# Patient Record
Sex: Female | Born: 1970 | Race: Black or African American | Hispanic: No | Marital: Single | Smoking: Current some day smoker
Health system: Southern US, Community
[De-identification: ages and names within clinical notes are randomized; demographics above are authoritative.]

## PROBLEM LIST (undated history)

## (undated) DIAGNOSIS — T1491XA Suicide attempt, initial encounter: Secondary | ICD-10-CM

## (undated) DIAGNOSIS — F319 Bipolar disorder, unspecified: Secondary | ICD-10-CM

## (undated) DIAGNOSIS — K219 Gastro-esophageal reflux disease without esophagitis: Secondary | ICD-10-CM

## (undated) DIAGNOSIS — G43909 Migraine, unspecified, not intractable, without status migrainosus: Secondary | ICD-10-CM

## (undated) DIAGNOSIS — Z8659 Personal history of other mental and behavioral disorders: Secondary | ICD-10-CM

## (undated) DIAGNOSIS — F329 Major depressive disorder, single episode, unspecified: Secondary | ICD-10-CM

## (undated) DIAGNOSIS — F32A Depression, unspecified: Secondary | ICD-10-CM

## (undated) HISTORY — PX: LIPOMA EXCISION: SHX5283

## (undated) HISTORY — DX: Gastro-esophageal reflux disease without esophagitis: K21.9

## (undated) HISTORY — DX: Migraine, unspecified, not intractable, without status migrainosus: G43.909

## (undated) HISTORY — PX: BUNIONECTOMY: SHX129

## (undated) HISTORY — DX: Personal history of other mental and behavioral disorders: Z86.59

## (undated) HISTORY — PX: LAMINECTOMY: SHX219

## (undated) HISTORY — PX: CHOLECYSTECTOMY: SHX55

---
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
## 2013-03-23 HISTORY — PX: BACK SURGERY: SHX140

## 2015-11-21 ENCOUNTER — Encounter (HOSPITAL_COMMUNITY): Payer: Self-pay | Admitting: Cardiology

## 2015-11-21 ENCOUNTER — Emergency Department (HOSPITAL_COMMUNITY)
Admission: EM | Admit: 2015-11-21 | Discharge: 2015-11-21 | Disposition: A | Discharge status: Other Acute Inpatient Hospital | Payer: Medicaid Other | Attending: Emergency Medicine | Admitting: Emergency Medicine

## 2015-11-21 ENCOUNTER — Inpatient Hospital Stay (HOSPITAL_COMMUNITY)
Admission: AD | Admit: 2015-11-21 | Discharge: 2015-11-27 | DRG: 885 | Disposition: A | Discharge status: Home / Self Care | Payer: MEDICAID | Source: Intra-hospital | Attending: Psychiatry | Admitting: Psychiatry

## 2015-11-21 DIAGNOSIS — Z79899 Other long term (current) drug therapy: Secondary | ICD-10-CM | POA: Insufficient documentation

## 2015-11-21 DIAGNOSIS — Y929 Unspecified place or not applicable: Secondary | ICD-10-CM | POA: Diagnosis not present

## 2015-11-21 DIAGNOSIS — Y999 Unspecified external cause status: Secondary | ICD-10-CM | POA: Insufficient documentation

## 2015-11-21 DIAGNOSIS — F32A Depression, unspecified: Secondary | ICD-10-CM

## 2015-11-21 DIAGNOSIS — Y939 Activity, unspecified: Secondary | ICD-10-CM | POA: Diagnosis not present

## 2015-11-21 DIAGNOSIS — X781XXA Intentional self-harm by knife, initial encounter: Secondary | ICD-10-CM | POA: Diagnosis not present

## 2015-11-21 DIAGNOSIS — Z23 Encounter for immunization: Secondary | ICD-10-CM | POA: Diagnosis not present

## 2015-11-21 DIAGNOSIS — Z7289 Other problems related to lifestyle: Secondary | ICD-10-CM

## 2015-11-21 DIAGNOSIS — G4700 Insomnia, unspecified: Secondary | ICD-10-CM | POA: Diagnosis present

## 2015-11-21 DIAGNOSIS — T1491 Suicide attempt: Secondary | ICD-10-CM | POA: Diagnosis present

## 2015-11-21 DIAGNOSIS — F329 Major depressive disorder, single episode, unspecified: Secondary | ICD-10-CM

## 2015-11-21 DIAGNOSIS — F323 Major depressive disorder, single episode, severe with psychotic features: Principal | ICD-10-CM | POA: Diagnosis present

## 2015-11-21 DIAGNOSIS — S1191XA Laceration without foreign body of unspecified part of neck, initial encounter: Secondary | ICD-10-CM | POA: Insufficient documentation

## 2015-11-21 DIAGNOSIS — K219 Gastro-esophageal reflux disease without esophagitis: Secondary | ICD-10-CM | POA: Diagnosis present

## 2015-11-21 DIAGNOSIS — F419 Anxiety disorder, unspecified: Secondary | ICD-10-CM | POA: Diagnosis present

## 2015-11-21 DIAGNOSIS — R45851 Suicidal ideations: Secondary | ICD-10-CM

## 2015-11-21 DIAGNOSIS — R45850 Homicidal ideations: Secondary | ICD-10-CM | POA: Diagnosis present

## 2015-11-21 HISTORY — DX: Suicide attempt, initial encounter: T14.91XA

## 2015-11-21 HISTORY — DX: Depression, unspecified: F32.A

## 2015-11-21 HISTORY — DX: Major depressive disorder, single episode, unspecified: F32.9

## 2015-11-21 HISTORY — DX: Bipolar disorder, unspecified: F31.9

## 2015-11-21 LAB — CBC
HEMATOCRIT: 36.3 % (ref 36.0–46.0)
Hemoglobin: 11.5 g/dL — ABNORMAL LOW (ref 12.0–15.0)
MCH: 23.3 pg — ABNORMAL LOW (ref 26.0–34.0)
MCHC: 31.7 g/dL (ref 30.0–36.0)
MCV: 73.5 fL — AB (ref 78.0–100.0)
Platelets: 226 10*3/uL (ref 150–400)
RBC: 4.94 MIL/uL (ref 3.87–5.11)
RDW: 15.6 % — AB (ref 11.5–15.5)
WBC: 6.2 10*3/uL (ref 4.0–10.5)

## 2015-11-21 LAB — RAPID URINE DRUG SCREEN, HOSP PERFORMED
Amphetamines: NOT DETECTED
BARBITURATES: NOT DETECTED
BENZODIAZEPINES: NOT DETECTED
Cocaine: NOT DETECTED
Opiates: NOT DETECTED
Tetrahydrocannabinol: NOT DETECTED

## 2015-11-21 LAB — COMPREHENSIVE METABOLIC PANEL
ALBUMIN: 4.2 g/dL (ref 3.5–5.0)
ALK PHOS: 82 U/L (ref 38–126)
ALT: 59 U/L — ABNORMAL HIGH (ref 14–54)
AST: 54 U/L — AB (ref 15–41)
Anion gap: 10 (ref 5–15)
BILIRUBIN TOTAL: 0.5 mg/dL (ref 0.3–1.2)
BUN: 6 mg/dL (ref 6–20)
CALCIUM: 9.3 mg/dL (ref 8.9–10.3)
CO2: 24 mmol/L (ref 22–32)
Chloride: 104 mmol/L (ref 101–111)
Creatinine, Ser: 0.8 mg/dL (ref 0.44–1.00)
GFR calc Af Amer: >60 mL/min (ref >60)
GFR calc non Af Amer: >60 mL/min (ref >60)
GLUCOSE: 105 mg/dL — AB (ref 65–99)
Potassium: 3.6 mmol/L (ref 3.5–5.1)
Sodium: 138 mmol/L (ref 135–145)
TOTAL PROTEIN: 7.7 g/dL (ref 6.5–8.1)

## 2015-11-21 LAB — ETHANOL: Alcohol, Ethyl (B): 9 mg/dL — ABNORMAL HIGH (ref <5)

## 2015-11-21 LAB — ACETAMINOPHEN LEVEL: Acetaminophen (Tylenol), Serum: <10 ug/mL — ABNORMAL LOW (ref 10–30)

## 2015-11-21 LAB — SALICYLATE LEVEL: Salicylate Lvl: <4 mg/dL (ref 2.8–30.0)

## 2015-11-21 MED ORDER — LORAZEPAM 1 MG PO TABS: 1.0000 mg | ORAL_TABLET | Freq: Three times a day (TID) | ORAL | Status: DC | PRN

## 2015-11-21 MED ORDER — BACITRACIN ZINC 500 UNIT/GM EX OINT
TOPICAL_OINTMENT | Freq: Once | CUTANEOUS | Status: AC
  Administered 2015-11-21: 2 via TOPICAL
  Filled 2015-11-21: qty 1.8

## 2015-11-21 MED ORDER — TRAZODONE HCL 50 MG PO TABS
50.0000 mg | ORAL_TABLET | Freq: Every evening | ORAL | Status: DC | PRN
Start: 2015-11-22 — End: 2015-11-22
  Filled 2015-11-21: qty 1

## 2015-11-21 MED ORDER — IBUPROFEN 400 MG PO TABS
600.0000 mg | ORAL_TABLET | Freq: Three times a day (TID) | ORAL | Status: DC | PRN
  Administered 2015-11-21: 600 mg via ORAL
  Filled 2015-11-21: qty 2

## 2015-11-21 MED ORDER — TETANUS-DIPHTH-ACELL PERTUSSIS 5-2.5-18.5 LF-MCG/0.5 IM SUSP
0.5000 mL | Freq: Once | INTRAMUSCULAR | Status: AC
  Administered 2015-11-21: 0.5 mL via INTRAMUSCULAR
  Filled 2015-11-21: qty 0.5

## 2015-11-21 NOTE — ED Notes (Signed)
Pt has superficial laceration to left lateral/anterior neck and bilateral anterior wrists. Pt reports she recently moved here from [place] and has been out of her medications for approximately 2 weeks. Pt states "it just all built up and I did this"- does not elaborate on specific stressors. Pt states she lives with her sister and mother and her sister found her this am and called 911. Pt states she does not have a psychiatrist/counselor or any outpatient resources here.

## 2015-11-21 NOTE — ED Provider Notes (Signed)
[facility] DEPT Provider Note   CSN: [id number] Arrival date & time: 11/21/15  1048  By signing my name below, I, Placido Sou, attest that this documentation has been prepared under the direction and in the presence of Cathren Laine, MD. Electronically Signed: Placido Sou, ED Scribe. 11/21/15. 12:07 PM.   History   Chief Complaint Chief Complaint  Patient presents with  . V70.1    HPI HPI Comments: Danielle Sweeney is a 45 y.o. female with a history of bipolar 1 disorder, depression and suicide attempt who presents to the Emergency Department due to multiple self inflicted lacerations w/o active bleeding to her neck and wrists which occurred last night. Pt states she has been off of all of her medications for 2 weeks and beginning 1 week ago began feeling suicidal. Last night she used a knife to cut her left neck and bilateral volar wrists. Pt has an IUD (Mirena) in place. She is unsure of the timing of her most recent tetanus vaccinations. Pt denies any other regions of injury or having taken any medications. Pt denies any new stressors or health conditions. Pt has been admitted for behavioral health services in the past. She denies changes in appetite, HA, sore throat, CP, abd pain, n/v/d/c and leg pain.    The history is provided by the patient. No language interpreter was used.    Past Medical History:  Diagnosis Date  . Bipolar 1 disorder (HCC)   . Depression   . Suicide attempt (HCC)     There are no active problems to display for this patient.   Past Surgical History:  Procedure Laterality Date  . BUNIONECTOMY    . LIPOMA EXCISION      OB History    No data available       Home Medications    Prior to Admission medications   Not on File    Family History History reviewed. No pertinent family history.  Social History Social History  Substance Use Topics  . Smoking status: Never Smoker  . Smokeless tobacco: Never Used  . Alcohol use Yes      Comment: occasional     Allergies   Codeine and Percocet [oxycodone-acetaminophen]   Review of Systems Review of Systems  Constitutional: Negative for appetite change.  HENT: Negative for sore throat.   Eyes: Negative for visual disturbance.  Respiratory: Negative for shortness of breath.   Cardiovascular: Negative for chest pain.  Gastrointestinal: Negative for abdominal pain, constipation, diarrhea, nausea and vomiting.  Skin: Positive for wound.  Neurological: Negative for weakness, numbness and headaches.  Psychiatric/Behavioral: Positive for self-injury and suicidal ideas. Negative for confusion.  All other systems reviewed and are negative.  Physical Exam Updated Vital Signs BP 140/70 (BP Location: Left Arm)   Pulse 82   Temp 98.1 F (36.7 C) (Oral)   Resp 16   Ht 5\' 7"  (1.702 m)   Wt 200 lb (90.7 kg)   SpO2 98%   BMI 31.32 kg/m   Physical Exam  Constitutional: She is oriented to person, place, and time. She appears well-developed and well-nourished. No distress.  HENT:  Head: Normocephalic and atraumatic.  Eyes: EOM are normal.  Neck: Normal range of motion.  Superficial lac left neck, not through all layers of skin, not through platysma.   Cardiovascular: Normal rate, regular rhythm and normal heart sounds.   Pulmonary/Chest: Effort normal and breath sounds normal.  Abdominal: Soft. She exhibits no distension. There is no tenderness.  Musculoskeletal: She exhibits  no edema.  V superficial self inflected wounds to bil wrists, not requiring sutures. Distal pulses palp bil.   Neurological: She is alert and oriented to person, place, and time.  Speech clear/fluent. Motor/sens intact bil ext.   Skin: Skin is warm and dry.  Psychiatric: She expresses suicidal ideation.  Depressed mood, flat affect. +SI.   Nursing note and vitals reviewed.   ED Treatments / Results  Labs (all labs ordered are listed, but only abnormal results are displayed) Results for  orders placed or performed during the hospital encounter of 11/21/15  Comprehensive metabolic panel  Result Value Ref Range   Sodium 138 135 - 145 mmol/L   Potassium 3.6 3.5 - 5.1 mmol/L   Chloride 104 101 - 111 mmol/L   CO2 24 22 - 32 mmol/L   Glucose, Bld 105 (H) 65 - 99 mg/dL   BUN 6 6 - 20 mg/dL   Creatinine, Ser 1.61 0.44 - 1.00 mg/dL   Calcium 9.3 8.9 - 09.6 mg/dL   Total Protein 7.7 6.5 - 8.1 g/dL   Albumin 4.2 3.5 - 5.0 g/dL   AST 54 (H) 15 - 41 U/L   ALT 59 (H) 14 - 54 U/L   Alkaline Phosphatase 82 38 - 126 U/L   Total Bilirubin 0.5 0.3 - 1.2 mg/dL   GFR calc non Af Amer >60 >60 mL/min   GFR calc Af Amer >60 >60 mL/min   Anion gap 10 5 - 15  Ethanol  Result Value Ref Range   Alcohol, Ethyl (B) 9 (H) <5 mg/dL  Salicylate level  Result Value Ref Range   Salicylate Lvl <4.0 2.8 - 30.0 mg/dL  Acetaminophen level  Result Value Ref Range   Acetaminophen (Tylenol), Serum <10 (L) 10 - 30 ug/mL  cbc  Result Value Ref Range   WBC 6.2 4.0 - 10.5 K/uL   RBC 4.94 3.87 - 5.11 MIL/uL   Hemoglobin 11.5 (L) 12.0 - 15.0 g/dL   HCT 04.5 40.9 - 81.1 %   MCV 73.5 (L) 78.0 - 100.0 fL   MCH 23.3 (L) 26.0 - 34.0 pg   MCHC 31.7 30.0 - 36.0 g/dL   RDW 91.4 (H) 78.2 - 95.6 %   Platelets 226 150 - 400 K/uL  Rapid urine drug screen (hospital performed)  Result Value Ref Range   Opiates NONE DETECTED NONE DETECTED   Cocaine NONE DETECTED NONE DETECTED   Benzodiazepines NONE DETECTED NONE DETECTED   Amphetamines NONE DETECTED NONE DETECTED   Tetrahydrocannabinol NONE DETECTED NONE DETECTED   Barbiturates NONE DETECTED NONE DETECTED   EKG  EKG Interpretation None       Radiology No results found.  Procedures Procedures  DIAGNOSTIC STUDIES: Oxygen Saturation is 98% on RA, normal by my interpretation.    COORDINATION OF CARE: 12:01 PM Discussed next steps with pt. Pt verbalized understanding and is agreeable with the plan.    Medications Ordered in ED Medications - No data  to display   Initial Impression / Assessment and Plan / ED Course  I have reviewed the triage vital signs and the nursing notes.  Pertinent labs & imaging results that were available during my care of the patient were reviewed by me and considered in my medical decision making (see chart for details).  Clinical Course    I personally performed the services described in this documentation, which was scribed in my presence. The recorded information has been reviewed and considered. Cathren Laine, MD  Labs sent.  BH team consulted.  Wounds do not require sutures.  Wounds cleaned, bacitracin and sterile dressings applied.  Pt unsure of when last tetanus imm.  Tetanus im.  Patient appears medically clear for psych eval.  [facility]     Final Clinical Impressions(s) / ED Diagnoses   Final diagnoses:  None    New Prescriptions New Prescriptions   No medications on file     Cathren LaineKevin Sidnee Gambrill, MD 11/21/15 1306

## 2015-11-21 NOTE — Progress Notes (Signed)
[facility] Attending Dr. Jama Flavorsobos, report [phone].

## 2015-11-21 NOTE — ED Triage Notes (Signed)
Pt self inflicted lacerations to throat and bilateral wrist.  Pt admits to being suicidal last night.  Denies SI at this time.

## 2015-11-21 NOTE — BH Assessment (Addendum)
Tele Assessment Note   Danielle Sweeney is an 45 y.o. female  who presents accompanied by police  reporting symptoms of depression and suicidal ideation after a suicide attempt by cutting her throat and wrists last night. She states she was found by her sister. Pt has a history of bipolar with psychotic features and says she was referred for assessment by her sister. Pt reports that she has been unable to take her medication for the past two weeks because her daughter has been mailing it to her from [place], and her daughter was unable to send it. "I am fine on my medications". She has a history of 5 past attempt and 3 ECT treatments. Last hospitalization was in 2008.   Pt acknowledges symptoms including crying spells, social withdrawal, loss of interest in usual pleasures, decreased concentration,  decreased sleep, and feelings of hopelessness. PT denies homicidal ideation or history of violence. Pt denies auditory or visual hallucinations or other psychotic symptoms, but does have this history. Pt denies alcohol or substance abuse.  Pt states current stressors include her recent move and financial problems. 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 She states she has gained 70 lbs since she separated from her husband 5 yrs ago. Pt lives with her mom and sister, and supports include both of them plus her daughter . Pt denies history of abuse and trauma. Pt reports there is a family history of a sister using crack. Pt has fair insight and judgement. 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  Pt's OP history includes treatment in [place].  Pt is casually dressed, alert, oriented x4 with normal speech and normal motor behavior. Eye contact is good.  Pt's mood is depressed and affect is congruent with mood. Thought process is coherent and relevant. There is no indication Pt is currently responding to internal stimuli or  experiencing delusional thought content. Pt was cooperative throughout assessment. Pt is currently unable to contract for safety outside the hospital and wants inpatient psychiatric treatment.  Dr. Lucianne Muss recommends OP treatment.  TTS to seek placement.    Diagnosis: Bipolar Disorder, severe, depressed without psychotic features  Past Medical History:  Past Medical History:  Diagnosis Date  . Bipolar 1 disorder (HCC)   . Depression   . Suicide attempt [facility])     Past Surgical History:  Procedure Laterality Date  . BUNIONECTOMY    . LIPOMA EXCISION      Family History: History reviewed. No pertinent family history.  Social History:  reports that she has never smoked. She has never used smokeless tobacco. She reports that she drinks alcohol. She reports that she does not use drugs.  Additional Social History:  Alcohol / Drug Use Pain Medications: denies Prescriptions: denies Over the Counter: denies History of alcohol / drug use?: No history of alcohol / drug abuse Longest period of sobriety (when/how long): denies Negative Consequences of Use:  (denies)  CIWA: CIWA-Ar BP: 140/70 Pulse Rate: 82 COWS:    PATIENT STRENGTHS: (choose at least two) Ability for insight Average or above average intelligence Capable of independent living Communication skills Motivation for treatment/growth Supportive family/friends Work skills  Allergies:  Allergies  Allergen Reactions  . Codeine Itching  . Percocet [Oxycodone-Acetaminophen] Itching    Home Medications:  (Not in a hospital admission)  OB/GYN Status:  No LMP recorded.  General Assessment Data Location of Assessment:  [facility] T[facility] Assessment: In system Is this a Tele or Face-to-Face Assessment?: Tele Assessment Is this an Initial Assessment or a Re-assessment for this encounter?: Initial Assessment Marital status: Separated Maiden name: Davonna BellingWarrick Is patient pregnant?: No Pregnancy Status: No Living Arrangements:  Parent (sister) Can pt return to current living arrangement?: Yes Admission Status: Voluntary Is patient capable of signing voluntary admission?: Yes Referral Source: Self/Family/Friend (police) Insurance type: MCD     Crisis Care Plan Living Arrangements: Parent (sister) Name of Psychiatrist:  (none ) Name of Therapist:  (in [place])  Education Status Is patient currently in school?: No Highest grade of school patient has completed:  (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 : Yes Suicidal Intent: Yes-Currently Present Has patient had any suicidal intent within the past 6 months prior to admission? : Yes Is patient at risk for suicide?: Yes Suicidal Plan?: Yes-Currently Present Has patient had any suicidal plan within the past 6 months prior to admission? : Yes Specify Current Suicidal Plan: cut wrist and throat last night Access to Means: Yes Specify Access to Suicidal Means: razor What has been your use of drugs/alcohol within the last 12 months?: denies Previous Attempts/Gestures: Yes How many times?: 5 Other Self Harm Risks: none Intentional Self Injurious Behavior: None Family Suicide History: No Recent stressful life event(s): Financial Problems (moving) Persecutory voices/beliefs?: No Depression: Yes Depression Symptoms: Insomnia, Isolating, Loss of interest in usual pleasures, Feeling worthless/self pity Substance abuse history and/or treatment for substance abuse?: No Suicide prevention information given to non-admitted patients: Not applicable  Risk to Others within the past 6 months Homicidal Ideation: No Does patient have any lifetime risk of violence toward others beyond the six months prior to admission? : No Thoughts of Harm to Others: No Current Homicidal Intent: No Current Homicidal Plan: No Access to Homicidal Means:  No History of harm to others?: No Assessment of Violence: None Noted Does patient have access to weapons?: No Criminal Charges Pending?: No Does patient have a court date: No Is patient on probation?: No  Psychosis Hallucinations: None noted Delusions: None noted  Mental Status Report Appearance/Hygiene: In scrubs, Unremarkable Eye Contact: Good Motor Activity: Unremarkable Speech: Logical/coherent Level of Consciousness: Alert Mood: Depressed, Pleasant Affect: Appropriate to circumstance Anxiety Level: None Thought Processes: Coherent, Relevant Judgement: Partial Orientation: Person, Place, Time, Situation, Appropriate for developmental age Obsessive Compulsive Thoughts/Behaviors: Moderate (cleaning)  Cognitive Functioning Concentration: Fair Memory: Remote Impaired, Recent Impaired (from ECT) IQ: Average Insight: Good Impulse Control: Poor Appetite: Fair Weight Gain:  (70 lbs in 5 years) Sleep: Decreased Total Hours of Sleep: 4 Vegetative Symptoms: None  ADLScreening [facility] Assessment Services) [name] cognitive ability adequate to safely complete daily activities?: Yes Patient able to express need for assistance with ADLs?: Yes Independently performs ADLs?: Yes (appropriate for developmental age)  Prior Inpatient Therapy Prior Inpatient Therapy: Yes (3) Prior Therapy Dates:  (2008 last time) Prior Therapy Facilty/Provider(s):  (in [place]) Reason for Treatment:  (depression)  Prior Outpatient Therapy Prior Outpatient Therapy: Yes Prior Therapy Dates: ended in May 2017 when pt moved Reason for Treatment:  (depression) Does patient have an ACCT team?: No Does patient have Intensive In-House Services?  : No Does patient have [facility] services? : No Does patient have P4CC services?: No  ADL Screening (condition at time of admission) [name] cognitive ability adequate to safely complete daily activities?:  Yes Is the patient deaf or have difficulty hearing?:  No Does the patient have difficulty seeing, even when wearing glasses/contacts?: No Does the patient have difficulty concentrating, remembering, or making decisions?: No Patient able to express need for assistance with ADLs?: Yes Does the patient have difficulty dressing or bathing?: No Independently performs ADLs?: Yes (appropriate for developmental age) Does the patient have difficulty walking or climbing stairs?: No Weakness of Legs: None Weakness of Arms/Hands: None  Home Assistive Devices/Equipment Home Assistive Devices/Equipment: None    Abuse/Neglect Assessment (Assessment to be complete while patient is alone) Physical Abuse: Denies Verbal Abuse: Denies Sexual Abuse: Denies Exploitation of patient/patient's resources: Denies Self-Neglect: Denies Values / Beliefs Cultural Requests During Hospitalization: None Spiritual Requests During Hospitalization: None   Advance Directives (For Healthcare) Does patient have an advance directive?: No Would patient like information on creating an advanced directive?: No - patient declined information    Additional Information 1:1 In Past 12 Months?: No CIRT Risk: No Elopement Risk: No Does patient have medical clearance?: Yes     Disposition:  Disposition Initial Assessment Completed for this Encounter: Yes Disposition of Patient: Inpatient treatment program Type of inpatient treatment program: Adult  Theo Dills 11/21/2015 2:39 PM

## 2015-11-21 NOTE — Progress Notes (Signed)
Pt referred to inpatient psychiatric treatment at recommendation of TTS.  [facility]- per Jim Desanctisarla Rowan - per [facility]al[facility] GumsBarbara [facility]  Ilean SkillMeghan Ferguson Gertner, MSW, LCSW Clinical Social Work, Disposition  11/21/2015 [phone]

## 2015-11-22 ENCOUNTER — Encounter (HOSPITAL_COMMUNITY): Payer: Self-pay | Admitting: *Deleted

## 2015-11-22 DIAGNOSIS — F329 Major depressive disorder, single episode, unspecified: Secondary | ICD-10-CM | POA: Diagnosis not present

## 2015-11-22 DIAGNOSIS — R45851 Suicidal ideations: Secondary | ICD-10-CM | POA: Diagnosis not present

## 2015-11-22 DIAGNOSIS — R45850 Homicidal ideations: Secondary | ICD-10-CM

## 2015-11-22 DIAGNOSIS — Z888 Allergy status to other drugs, medicaments and biological substances status: Secondary | ICD-10-CM | POA: Diagnosis not present

## 2015-11-22 DIAGNOSIS — Z79899 Other long term (current) drug therapy: Secondary | ICD-10-CM

## 2015-11-22 MED ORDER — PALIPERIDONE ER 6 MG PO TB24
6.0000 mg | ORAL_TABLET | Freq: Every day | ORAL | Status: DC
  Administered 2015-11-22 – 2015-11-27 (×6): 6 mg via ORAL
  Filled 2015-11-22 (×3): qty 1
  Filled 2015-11-22: qty 7
  Filled 2015-11-22 (×4): qty 1

## 2015-11-22 MED ORDER — ZOLPIDEM TARTRATE 5 MG PO TABS
5.0000 mg | ORAL_TABLET | Freq: Every day | ORAL | Status: DC
  Administered 2015-11-22 – 2015-11-24 (×3): 5 mg via ORAL
  Filled 2015-11-22 (×3): qty 1

## 2015-11-22 MED ORDER — ZOLPIDEM TARTRATE 10 MG PO TABS: 10.0000 mg | ORAL_TABLET | Freq: Every day | ORAL | Status: DC

## 2015-11-22 MED ORDER — CLONAZEPAM 1 MG PO TABS: 1.5000 mg | ORAL_TABLET | Freq: Two times a day (BID) | ORAL | Status: DC | PRN

## 2015-11-22 MED ORDER — MAGNESIUM HYDROXIDE 400 MG/5ML PO SUSP: 30.0000 mL | Freq: Every day | ORAL | Status: DC | PRN

## 2015-11-22 MED ORDER — LAMOTRIGINE 200 MG PO TABS: 200.0000 mg | ORAL_TABLET | Freq: Two times a day (BID) | ORAL | Status: DC

## 2015-11-22 MED ORDER — TRAZODONE HCL 50 MG PO TABS
50.0000 mg | ORAL_TABLET | Freq: Every evening | ORAL | Status: DC | PRN
  Administered 2015-11-22 – 2015-11-25 (×7): 50 mg via ORAL
  Filled 2015-11-22 (×11): qty 1

## 2015-11-22 MED ORDER — BENZTROPINE MESYLATE 1 MG PO TABS
1.0000 mg | ORAL_TABLET | Freq: Two times a day (BID) | ORAL | Status: DC
  Administered 2015-11-22 – 2015-11-27 (×11): 1 mg via ORAL
  Filled 2015-11-22 (×2): qty 1
  Filled 2015-11-22: qty 14
  Filled 2015-11-22 (×4): qty 1
  Filled 2015-11-22: qty 14
  Filled 2015-11-22 (×7): qty 1

## 2015-11-22 MED ORDER — CLONAZEPAM 0.5 MG PO TABS
0.5000 mg | ORAL_TABLET | Freq: Two times a day (BID) | ORAL | Status: DC | PRN
  Administered 2015-11-22 – 2015-11-26 (×5): 0.5 mg via ORAL
  Filled 2015-11-22 (×5): qty 1

## 2015-11-22 MED ORDER — PROPRANOLOL HCL 10 MG PO TABS
10.0000 mg | ORAL_TABLET | Freq: Every day | ORAL | Status: DC
  Administered 2015-11-22 – 2015-11-27 (×6): 10 mg via ORAL
  Filled 2015-11-22: qty 7
  Filled 2015-11-22 (×7): qty 1

## 2015-11-22 MED ORDER — LAMOTRIGINE 25 MG PO TABS
25.0000 mg | ORAL_TABLET | Freq: Every day | ORAL | Status: DC
  Administered 2015-11-22 – 2015-11-27 (×6): 25 mg via ORAL
  Filled 2015-11-22: qty 1
  Filled 2015-11-22: qty 7
  Filled 2015-11-22 (×6): qty 1

## 2015-11-22 MED ORDER — ALUM & MAG HYDROXIDE-SIMETH 200-200-20 MG/5ML PO SUSP
30.0000 mL | ORAL | Status: DC | PRN
  Administered 2015-11-26 (×2): 30 mL via ORAL
  Filled 2015-11-22 (×2): qty 30

## 2015-11-22 MED ORDER — PALIPERIDONE ER 6 MG PO TB24: 9.0000 mg | ORAL_TABLET | Freq: Every day | ORAL | Status: DC

## 2015-11-22 MED ORDER — IBUPROFEN 400 MG PO TABS
400.0000 mg | ORAL_TABLET | Freq: Three times a day (TID) | ORAL | Status: DC | PRN
  Administered 2015-11-22 – 2015-11-24 (×5): 400 mg via ORAL
  Filled 2015-11-22 (×6): qty 1

## 2015-11-22 MED ORDER — ESCITALOPRAM OXALATE 20 MG PO TABS
20.0000 mg | ORAL_TABLET | Freq: Every day | ORAL | Status: DC
  Administered 2015-11-22 – 2015-11-27 (×6): 20 mg via ORAL
  Filled 2015-11-22 (×5): qty 1
  Filled 2015-11-22: qty 7
  Filled 2015-11-22 (×2): qty 1

## 2015-11-22 MED ORDER — ARIPIPRAZOLE 2 MG PO TABS
2.0000 mg | ORAL_TABLET | Freq: Every day | ORAL | Status: DC
  Administered 2015-11-22 – 2015-11-23 (×2): 2 mg via ORAL
  Filled 2015-11-22 (×4): qty 1

## 2015-11-22 NOTE — Progress Notes (Signed)
Recreation Therapy Notes  Date: 11/22/15 Time: 0930 Location: [address] Dayroom  Group Topic: Stress Management  Goal Area(s) Addresses:  Patient will verbalize importance of using healthy stress management.  Patient will identify positive emotions associated with healthy stress management.   Behavioral Response: Engaged  Intervention: Stress Management  Activity :  Guided Training and development officermagery Script.  LRT introduced the stress management technique guided imagery.  LRT read script that guided patients in how to perform the technique.  Patients were to follow along as LRT read the script to engage in the technique.  Education: Stress Management, Discharge Planning.   Education Outcome: Acknowledges edcuation/In group clarification offered/Needs additional education  Clinical Observations/Feedback: Pt attended group.   Danielle Sweeney, Danielle Sweeney         Lillia AbedLindsay, Danielle Sweeney 11/22/2015 1:10 PM

## 2015-11-22 NOTE — H&P (Signed)
Psychiatric Admission Assessment Adult  Patient Identification: Danielle Sweeney MRN:  [id number] Date of Evaluation:  11/22/2015 Chief Complaint:  BIPOLAR SEVERE DEPRESSED WITHOUT PSYCHOSIS Principal Diagnosis: <principal problem not specified> Diagnosis:   Patient Active Problem List   Diagnosis Date Noted  . MDD (major depressive disorder) ([place]) [F32.9] 11/22/2015   History of Present Illness:Danielle Sweeney is a 45 y.o. female with a history of bipolar 1 disorder, depression and suicide attempt who presented to the Emergency Department due to multiple self inflicted lacerations w/o active bleeding to her neck and wrists which occurred the night before . Pt states she has been off of all of her medications for 2 weeks and beginning 1 week ago began feeling suicidal. Last night she used a knife to cut her left neck and bilateral volar wrists.  Associated Signs/Symptoms: Depression Symptoms:  depressed mood, insomnia, difficulty concentrating, recurrent thoughts of death, suicidal thoughts with specific plan, suicidal attempt, disturbed sleep, (Hypo) Manic Symptoms:  None Anxiety Symptoms:  None Psychotic Symptoms:  None PTSD Symptoms: None Total Time spent with patient: 1 hour  Past Psychiatric History: Long history of psychiatric treament --28 years. 3 hospitalizations ., had 4 treatments of ECT during one of the hospitalization-- She says it helped her not ruminate on her childhood depression but she believes it has caused ger some long term memory problems. Had been on lamictal 22m bid, lexapro 254mdaily, Klonopin1.55m355mid,abilify 2mg63md,cogentin img bid,invega 9mg 63mly, propranolol for tremors 10mg 66my and ambien 10mg a855mdtime  Is the patient at risk to self? Yes.    Has the patient been a risk to self in the past 6 months? Yes.    Has the patient been a risk to self within the distant past? Yes.    Is the patient a risk to others? No.  Has the patient  been a risk to others in the past 6 months? No.  Has the patient been a risk to others within the distant past? No.   Prior Inpatient Therapy:   Prior Outpatient Therapy:    Alcohol Screening: 1. How often do you have a drink containing alcohol?: Never 9. Have you or someone else been injured as a result of your drinking?: No 10. Has a relative or friend or a doctor or another health worker been concerned about your drinking or suggested you cut down?: No Alcohol Use Disorder Identification Test Final Score (AUDIT): 0 Brief Intervention: AUDIT score less than 7 or less-screening does not suggest unhealthy drinking-brief intervention not indicated Substance Abuse History in the last 12 months:  No. Consequences of Substance Abuse: NA Previous Psychotropic Medications: Yes  Psychological Evaluations: No  Past Medical History:  Past Medical History:  Diagnosis Date  . Bipolar 1 disorder (HCC)   [place] [facility]   . [facility] ([facility]:  Procedure Laterality Date  . BUNIONECTOMY    . LIPOMA EXCISION     Family History: History reviewed. No pertinent family history. Family Psychiatric  History: Maternal great aunt may have been psychotic Tobacco Screening: Have you used any form of tobacco in the last 30 days? (Cigarettes, Smokeless Tobacco, Cigars, and/or Pipes): No Social History:  History  Alcohol Use  . Yes    Comment: occasional     History  Drug Use No    Additional Social History:  Allergies:   Allergies  Allergen Reactions  . Codeine Itching  . Percocet [Oxycodone-Acetaminophen] Itching   Lab Results:  Results for orders placed or performed during the hospital encounter of 11/21/15 (from the past 48 hour(s))  Rapid urine drug screen (hospital performed)     Status: None   Collection Time: 11/21/15 11:00 AM  Result Value Ref Range   Opiates NONE DETECTED NONE DETECTED   Cocaine NONE DETECTED NONE  DETECTED   Benzodiazepines NONE DETECTED NONE DETECTED   Amphetamines NONE DETECTED NONE DETECTED   Tetrahydrocannabinol NONE DETECTED NONE DETECTED   Barbiturates NONE DETECTED NONE DETECTED    Comment:        DRUG SCREEN FOR MEDICAL PURPOSES ONLY.  IF CONFIRMATION IS NEEDED FOR ANY PURPOSE, NOTIFY LAB WITHIN 5 DAYS.        LOWEST DETECTABLE LIMITS FOR URINE DRUG SCREEN Drug Class       Cutoff (ng/mL) Amphetamine      1000 Barbiturate      200 Benzodiazepine   268 Tricyclics       341 Opiates          300 Cocaine          300 THC              50   Comprehensive metabolic panel     Status: Abnormal   Collection Time: 11/21/15 11:13 AM  Result Value Ref Range   Sodium 138 135 - 145 mmol/L   Potassium 3.6 3.5 - 5.1 mmol/L   Chloride 104 101 - 111 mmol/L   CO2 24 22 - 32 mmol/L   Glucose, Bld 105 (H) 65 - 99 mg/dL   BUN 6 6 - 20 mg/dL   Creatinine, Ser 0.80 0.44 - 1.00 mg/dL   Calcium 9.3 8.9 - 10.3 mg/dL   Total Protein 7.7 6.5 - 8.1 g/dL   Albumin 4.2 3.5 - 5.0 g/dL   AST 54 (H) 15 - 41 U/L   ALT 59 (H) 14 - 54 U/L   Alkaline Phosphatase 82 38 - 126 U/L   Total Bilirubin 0.5 0.3 - 1.2 mg/dL   GFR calc non Af Amer >60 >60 mL/min   GFR calc Af Amer >60 >60 mL/min    Comment: (NOTE) The eGFR has been calculated using the CKD EPI equation. This calculation has not been validated in all clinical situations. eGFR's persistently <60 mL/min signify possible Chronic Kidney Disease.    Anion gap 10 5 - 15  Ethanol     Status: Abnormal   Collection Time: 11/21/15 11:13 AM  Result Value Ref Range   Alcohol, Ethyl (B) 9 (H) <5 mg/dL    Comment:        LOWEST DETECTABLE LIMIT FOR SERUM ALCOHOL IS 5 mg/dL FOR MEDICAL PURPOSES ONLY   Salicylate level     Status: None   Collection Time: 11/21/15 11:13 AM  Result Value Ref Range   Salicylate Lvl <9.6 2.8 - 30.0 mg/dL  Acetaminophen level     Status: Abnormal   Collection Time: 11/21/15 11:13 AM  Result Value Ref Range    Acetaminophen (Tylenol), Serum <10 (L) 10 - 30 ug/mL    Comment:        THERAPEUTIC CONCENTRATIONS VARY SIGNIFICANTLY. A RANGE OF 10-30 ug/mL MAY BE AN EFFECTIVE CONCENTRATION FOR MANY PATIENTS. HOWEVER, SOME ARE BEST TREATED AT CONCENTRATIONS OUTSIDE THIS RANGE. ACETAMINOPHEN CONCENTRATIONS >150 ug/mL AT 4 HOURS AFTER INGESTION AND >50 ug/mL AT 12 HOURS AFTER INGESTION  ARE OFTEN ASSOCIATED WITH TOXIC REACTIONS.   cbc     Status: Abnormal   Collection Time: 11/21/15 11:13 AM  Result Value Ref Range   WBC 6.2 4.0 - 10.5 K/uL   RBC 4.94 3.87 - 5.11 MIL/uL   Hemoglobin 11.5 (L) 12.0 - 15.0 g/dL   HCT 36.3 36.0 - 46.0 %   MCV 73.5 (L) 78.0 - 100.0 fL   MCH 23.3 (L) 26.0 - 34.0 pg   MCHC 31.7 30.0 - 36.0 g/dL   RDW 15.6 (H) 11.5 - 15.5 %   Platelets 226 150 - 400 K/uL    Blood Alcohol level:  Lab Results  Component Value Date   ETH 9 (H) 09/73/5329    Metabolic Disorder Labs:  No results found for: HGBA1C, MPG No results found for: PROLACTIN No results found for: CHOL, TRIG, HDL, CHOLHDL, VLDL, LDLCALC  Current Medications: Current Facility-Administered Medications  Medication Dose Route Frequency Provider Last Rate Last Dose  . alum & mag hydroxide-simeth (MAALOX/MYLANTA) 200-200-20 MG/5ML suspension 30 mL  30 mL Oral Q4H PRN Myer Peer Cobos, MD      . magnesium hydroxide (MILK OF MAGNESIA) suspension 30 mL  30 mL Oral Daily PRN Jenne Campus, MD      . traZODone (DESYREL) tablet 50 mg  50 mg Oral QHS,MR X 1 Dara Hoyer, PA-C   50 mg at 11/22/15 0049   PTA Medications: Prescriptions Prior to Admission  Medication Sig Dispense Refill Last Dose  . ARIPiprazole (ABILIFY) 2 MG tablet Take 2 mg by mouth daily.   11/01/2015  . benztropine (COGENTIN) 1 MG tablet Take 1 mg by mouth 2 (two) times daily.   11/01/2015  . clonazePAM (KLONOPIN) 1 MG tablet Take 1.5 mg by mouth 2 (two) times daily as needed for anxiety.   11/01/2015  . escitalopram (LEXAPRO) 20 MG tablet Take  20 mg by mouth daily.   11/01/2015  . lamoTRIgine (LAMICTAL) 200 MG tablet Take 200 mg by mouth 2 (two) times daily.   11/01/2015  . paliperidone (INVEGA) 9 MG 24 hr tablet Take 9 mg by mouth daily.   11/01/2015  . propranolol (INDERAL) 10 MG tablet Take 10 mg by mouth daily.   11/01/2015  . zolpidem (AMBIEN) 10 MG tablet Take 10 mg by mouth at bedtime.   11/01/2015    Musculoskeletal: Strength & Muscle Tone: within normal limits Gait & Station: normal Patient leans: Right  Psychiatric Specialty Exam: Physical Exam  ROS  Blood pressure 131/79, pulse 93, temperature 98.4 F (36.9 C), temperature source Oral, resp. rate 18, height '5\' 4"'  (1.626 m), weight 91.6 kg (202 lb).Body mass index is 34.67 kg/m.  General Appearance: Casual  Eye Contact:  Fair  Speech:  Clear and Coherent and Normal Rate  Volume:  Normal  Mood:  Depressed  Affect:  Depressed  Thought Process:  Coherent and Goal Directed  Orientation:  Full (Time, Place, and Person)  Thought Content:  Logical  Suicidal Thoughts:  Yes.  with intent/plan  Homicidal Thoughts:  Yes.  with intent/plan  Memory:  Immediate;   Fair Recent;   Fair Remote;   Fair  Judgement:  Fair  Insight:  Fair  Psychomotor Activity:  Normal  Concentration:  Concentration: Fair and Attention Span: Fair  Recall:  [employer] of Knowledge:  Fair  Language:  Fair  Akathisia:  No  Handed:  Right  AIMS (if indicated):     Assets:  Communication Skills Desire for Improvement  Housing Social Support  ADL's:  Intact  Cognition:  WNL  Sleep:          Treatment Plan Summary: Daily contact with patient to assess and evaluate symptoms and progress in treatment, Medication management and Plan to restart home medications but at a lesser dose and increase gradually. Patient will participate in group milieu and individual activities  Observation Level/Precautions:  15 minute checks  Laboratory:  Not been sexually active for 5 years  Psychotherapy:     Medications:    Consultations:    Discharge Concerns:    Estimated LOS:  Other:     I certify that inpatient services furnished can reasonably be expected to improve the patient's condition.    Ruffin Frederick, MD 9/1/20177:51 AM

## 2015-11-22 NOTE — Progress Notes (Signed)
Admission Note:  45 yr old female who presents voluntary, in no acute distress, following a suicide attempt.  Patient reports "I took a knife, said the Lords prayer, and cut myself thinking I would bleed out".  Patient appears flat and depressed. Patient was calm and cooperative with admission process. Patient presents with passive SI and contracts for safety upon admission. Patient denies AVH. Patient reports that she recently moved here from [place].  Patient reports that she has been off her medications since August 11th and had been having her daughter send them from [place] but recently has been unable to get in touch with her daughter.  Patient reports "I started getting depressed, sad, and worrying about my medications".  Patient continues by stating "I wrote letters to people telling them I'm sorry I'm not going to be here anymore and cut myself".  Patient reports that she currently lives with her mother and sister and states that her sister found her bleeding in the bedroom.  Patient identifies her mother and sister as her support system.  [facility], patient would like to "Look for support and find a Therapist, sportssychiatrist and counselor" and "Get back on meds".  Skin was assessed.  Patient had self-inflicted superficial cuts to wrist bilateral and neck.  Patient searched and no contraband found, POC and unit policies explained and understanding verbalized. Consents obtained. Patient had no additional questions or concerns.

## 2015-11-22 NOTE — Plan of Care (Signed)
Problem: Activity: Goal: Sleeping patterns will improve Outcome: Not Progressing Patient has been napping in her room most of shift, even after encouragement to remain up. Patient did attend groups and meals.

## 2015-11-22 NOTE — [facility] Group Notes (Signed)
[facility] LCSW Group Therapy  11/22/2015 9:33 PM  Type of Therapy:  Group Therapy  Participation Level:  Did Not Attend  Modes of Intervention:  Discussion, Education, Socialization and Support  Summary of Progress/Problems: Feelings around Relapse. Group members discussed the meaning of relapse and shared personal stories of relapse, how it affected them and others, and how they perceived themselves during this time. Group members were encouraged to identify triggers, warning signs and coping skills used when facing the possibility of relapse. Social supports were discussed and explored in detail.  Maresa Morash L Tamyia Minich MSW, LCSWA  11/22/2015, 9:33 PM   

## 2015-11-22 NOTE — [facility] Suicide Risk Assessment (Signed)
[facility]   Nursing information obtained from:  Patient Demographic factors:  Divorced or widowed, Unemployed Current Mental Status:  Suicidal ideation indicated by patient, Suicide plan, Plan includes specific time, place, or method, Self-harm thoughts, Self-harm behaviors, Intention to act on suicide plan, Belief that plan would result in death Loss Factors:  Decrease in vocational status Historical Factors:  Prior suicide attempts, Family history of mental illness or substance abuse Risk Reduction Factors:  Living with another person, especially a relative, Positive social support  Total Time spent with patient: 1 hour Principal Problem: <principal problem not specified> Diagnosis:   Patient Active Problem List   Diagnosis Date Noted  . MDD (major depressive disorder) (HCC) [F32.9] 11/22/2015   Subjective Data: See admiddion assessment  Continued Clinical Symptoms:  Alcohol Use Disorder Identification Test Final Score (AUDIT): 0 The "Alcohol Use Disorders Identification Test", Guidelines for Use in Primary Care, Second Edition.  World Science writerHealth Organization [facility](WHO). Score between 0-7:  no or low risk or alcohol related problems. Score between 8-15:  moderate risk of alcohol related problems. Score between 16-19:  high risk of alcohol related problems. Score 20 or above:  warrants further diagnostic evaluation for alcohol dependence and treatment.   CLINICAL FACTORS:   Bipolar Disorder:   Depressive phase   Musculoskeletal: Strength & Muscle Tone: within normal limits Gait & Station: normal Patient leans: Right  Psychiatric Specialty Exam: Physical Exam  ROS  Blood pressure 117/70, pulse 93, temperature 98 F (36.7 C), temperature source Oral, resp. rate 18, height 5\' 4"  (1.626 m), weight 91.6 kg (202 lb).Body mass index is 34.67 kg/m.  See admission assessment COGNITIVE FEATURES THAT CONTRIBUTE TO RISK:  None    SUICIDE RISK:   Moderate:   Frequent suicidal ideation with limited intensity, and duration, some specificity in terms of plans, no associated intent, good self-control, limited dysphoria/symptomatology, some risk factors present, and identifiable protective factors, including available and accessible social support.   PLAN OF CARE: See admission assessment  I certify that inpatient services furnished can reasonably be expected to improve the patient's condition.  Wynelle BourgeoisUreh N Lekauwa, MD 11/22/2015, 8:17 AM

## 2015-11-22 NOTE — Tx Team (Signed)
Initial Treatment Plan 11/22/2015 1:03 AM Danielle Sweeney ZOX:[id number]    PATIENT STRESSORS: Medication change or noncompliance   PATIENT STRENGTHS: Capable of independent living Licensed conveyancerCommunication skills Financial means Motivation for treatment/growth Supportive family/friends   PATIENT IDENTIFIED PROBLEMS: At risk for suicide  Depression  "Look for support such as psychiatrist and counselor"  "Get back on meds"               DISCHARGE CRITERIA:  Ability to meet basic life and health needs Improved stabilization in mood, thinking, and/or behavior Medical problems require only outpatient monitoring Motivation to continue treatment in a less acute level of care Need for constant or close observation no longer present Verbal commitment to aftercare and medication compliance  PRELIMINARY DISCHARGE PLAN: Outpatient therapy Return to previous living arrangement  PATIENT/FAMILY INVOLVEMENT: This treatment plan has been presented to and reviewed with the patient, Danielle Sweeney.  The patient and family have been given the opportunity to ask questions and make suggestions.  Carleene OverlieMiddleton, Maanya Hippert P, RN 11/22/2015, 1:03 AM

## 2015-11-22 NOTE — Progress Notes (Addendum)
Data. Patient denies SI/HI/AVH. Patient has spent most of the shift isolating in her room. She is bright on interaction, but flat/blunted went not interacting. She has been C/O pain in her neck, throughout the day. Patient has refused to complete her self assessment. Action. Pain medication and heat packs applied to her neck for pain relief.  Emotional support and encouragement offered. Education provided on medication, indications and side effect. Q 15 minute checks done for safety. Response. Relief from pain in neck with medications and heat reported. Safety on the unit maintained through 15 minute checks.  Medications taken as prescribed. Attended groups. Remained calm and appropriate through out shift.

## 2015-11-22 NOTE — Progress Notes (Signed)
Adult Psychoeducational Group Note  Date:  11/22/2015 Time:  9:47 PM  Group Topic/Focus:  Wrap-Up Group:   The focus of this group is to help patients review their daily goal of treatment and discuss progress on daily workbooks.   Participation Level:  Active  Participation Quality:  Appropriate  Affect:  Appropriate  Cognitive:  Appropriate  Insight: Appropriate  Engagement in Group:  Engaged  Modes of Intervention:  Discussion  Additional Comments:  Patient attended wrap-up group and said her day was a 4. Her goal for today was to speak to physician about regulating her medications and he did.  She is still working on her coping skills.  Danielle Sweeney W Danielle Sweeney 11/22/2015, 9:47 PM

## 2015-11-23 DIAGNOSIS — F323 Major depressive disorder, single episode, severe with psychotic features: Principal | ICD-10-CM

## 2015-11-23 NOTE — [facility] Group Notes (Signed)
[facility] LCSW Group Therapy  11/23/2015 / 10:10 to 11:00 AM  Type of Therapy:  Group Therapy  Participation Level:  Active  Participation Quality:  Attentive and Sharing  Affect:  Flat  Cognitive:  Alert and Oriented  Insight:  Limited  Engagement in Therapy:  Limited  Modes of Intervention:  Discussion, Exploration, Rapport Building, Socialization and Support  Summary of Progress/Problems: The main focus of today's process group was for the patient to identify ways in which they have in the past sabotaged their own recovery. Motivational Interviewing was utilized to ask the group members what they get out of their self sabotaging behavior(s), and what reasons they may have for wanting to change. Patient shared only when asked direct questions yet was actively engaged in listening as evidenced by her eye contact, body language and the fact she moved into group circle verses continuing to sit outside of circle. Patient shared she is already feeling better knowing she is back on medication and can get resources here in [place]. Patient has been feeling isolated as depression increased   Harrill, Julious Payeratherine Campbell

## 2015-11-23 NOTE — Progress Notes (Signed)
Adult Psychoeducational Group Note  Date:  11/23/2015 Time:  9:28 PM  Group Topic/Focus:  Wrap-Up Group:   The focus of this group is to help patients review their daily goal of treatment and discuss progress on daily workbooks.   Participation Level:  Active  Participation Quality:  Appropriate  Affect:  Appropriate  Cognitive:  Appropriate  Insight: Appropriate  Engagement in Group:  Engaged  Modes of Intervention:  Discussion  Additional Comments:  Pt goal for today was to stay positive and to come out of room. Pt states that she has reached her goal. Pt rated today with an 8. Margorie Johnege, Tsuruko Murtha 11/23/2015, 9:28 PM

## 2015-11-23 NOTE — B[facility] Counselor (Signed)
Adult Comprehensive Assessment  Patient ID: Danielle Sweeney, female   DOB: 1970-11-28, [age] Y.[place].   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[place] and is only a few credits short of degree in [employer] Healthcare[employer] Services Employment / Job issues: Unemployed since move to  in May of 2017 Family Relationships: NA Surveyor, quantityinancial / Lack of resources (include bankruptcy): EMC[employer]Strained Housing / Lack of housing: NA Physical health (include injuries & life threatening diseases): NA other than being off psych med's for 2 weeks and experiencing decrease in ADLs and increased depression Social relationships: Risk analystsolating; has no support network here Substance abuse: NA Bereavement / Loss: Marriage  Living/Environment/Situation:  Living Arrangements: Parent, Other relatives Living conditions (as described by patient or guardian): Pt lives [place] mother and sister in [place] How long has patient lived in current situation?: 4 months in [place]; 5 years in [place] What is atmosphere in current home: Comfortable, ParamedicLoving, Supportive  Family History:  Marital status: Separated Separated, when?: 2012 What types of issues is patient dealing with in the relationship?: [name] depression and episode of noncompliance in 2012 Additional relationship information: Husband has never allowed pt to get her belongings from home since 2012 Are you sexually active?: No What is your sexual orientation?: Hetero Has your sexual activity been affected by drugs, alcohol, medication, or emotional stress?: CSW did not ask Does patient have children?: Yes How many children?: 2 How is patient's relationship with their children?: Good with both 24 and 25 YO daughters  Childhood History:  By whom was/is the patient raised?: Mother Additional childhood history information: Minimal contact with father throughout her life until his  death Description of patient's relationship with caregiver when they were a child: Good with mother; minimal contact w father [name] description of current relationship with people who raised him/her: Good w mother; father deceased How were you disciplined when you got in trouble as a child/adolescent?: "Scolded only; nothing harsh, we were good kids" Does patient have siblings?: Yes Number of Siblings: 3 Description of patient's current relationship with siblings: No relationship with one sister as she is crack abuser living on streets; minimal contact w brother in [place]; good w sister in the home Did patient suffer any verbal/emotional/physical/sexual abuse as a child?: No Did patient suffer from severe childhood neglect?: No Has patient ever been sexually abused/assaulted/raped as an adolescent or adult?: No Was the patient ever a victim of a crime or a disaster?: No Witnessed domestic violence?: No Has patient been effected by domestic violence as an adult?: No  Education:  Highest grade of school patient has completed: 14 Currently a Consulting civil engineerstudent?: No Learning disability?: No  Employment/Work Situation:   Employment situation: Unemployed [name] job has been impacted by current illness: No What is the longest time patient has a held a job?: 24 years in [employer] Where was the patient employed at that time?: [place] Has patient ever been in the Eli [employer] and Companymilitary?: No Has patient ever served in combat?: No Did You Receive Any Psychiatric Treatment/Services While in Equities traderthe Military?: No Are There Guns or Other Weapons in Your Home?: No  Financial Resources:   Surveyor, quantityinancial resources: Support from parents / caregiver Does patient have a Lawyerrepresentative payee or guardian?: No  Alcohol/Substance Abuse:   What has been your use of drugs/alcohol within the last 12 months?: "1 glass of wine every now and then (Maybe 6 to 8 glasses per year);  not THC or other street drugs" Alcohol/Substance  Abuse Treatment Hx: Denies past history Has alcohol/substance abuse ever caused legal problems?: No  Social Support System:   Conservation officer, nature Support System: Fair Development worker, community Support System: Mother and sister Type of faith/religion: Belief in Spirituality How does patient's faith help to cope with current illness?: "It just does:"  Leisure/Recreation:   Leisure and Hobbies: Diplomatic Services operational officer (last time took any photos was 2012)  Strengths/Needs:   What things does the patient do well?: "I am a people person" In what areas does patient struggle / problems for patient: "Depression"  Discharge Plan:   Does patient have access to transportation?: No Plan for no access to transportation at discharge: [place] [facility] Will patient be returning to same living situation after discharge?: Yes Currently receiving community mental health services: No If no, would patient like referral for services when discharged?: Yes (What county?) [place]) Does patient have financial barriers related to discharge medications?: Yes Patient description of barriers related to discharge medications: No current income and Medicaid needs to be transferred to [facility]  Summary/Recommendations:   Summary and Recommendations (to be completed by the evaluator): Patient is 45 YO separated female admitted to [facility] after reporting suicidal ideation and increased depression due to noncompliance. Patient reports primary triggers for admission include lack of [place] provider, being without her psych med's which daughter was to send from [place], and [place] family [place].  Patient will benefit from crisis stabilization, medication evaluation, group therapy and psycho education, in addition to case management for discharge planning. At discharge it is recommended that patient adhere to the established discharge plan and continue in treatment.   Carney Bern. 11/23/2015

## 2015-11-23 NOTE — Progress Notes (Signed)
DAR Note: Patient seen watching TV. No interaction. Presents with flat affect. Complained of pain @ neck as a result of the cuts. Denies SI, AH/VH. Patient still complained of insomnia after getting her scheduled Trazodone x 2 and Ambien. Patient encouraged to allow the meds to work. Offered fluid, support. Patient seen sleeping after 45 mins of reporting not been able to sleep. Sleeping at this time. Will continue to moniotr patient. Safety maintained by every 15 mins check.

## 2015-11-23 NOTE — Progress Notes (Signed)
D Nicole CellaDorothy is seen OOB UAL on the 400 hall today...she tolerates this well. She attends her groups and is engaged in her recovery as evidenced by her statements" I know I need this help and that I need to be here..." A She completed her daiy assessment and on it she wrote she deneid SI today and she rated her depression, hopelessness and anxiety " 2/0/9", respectively. She has a pressured speech / mannerism about  Her speech and a delay in her repsonse. R Safety is in place.

## 2015-11-23 NOTE — [facility] Group Notes (Signed)
[facility] Group Notes:  (Nursing/MHT/Case Management/Adjunct)  Date:  11/23/2015  Time:  10:25 AM  Type of Therapy:  Nurse Education  Participation Level:  Active  Participation Quality:  Appropriate  Affect:  Appropriate  Cognitive:  Appropriate  Insight:  Good  Engagement in Group:  Engaged  Modes of Intervention:  Problem-solving  Summary of Progress/Problems: [name] goal for today was: To have a goal. Three words that she used to describe herself were,"Encouraged, relaxed and blessed."  Danielle Sweeney 11/23/2015, 10:25 AM

## 2015-11-23 NOTE — Progress Notes (Signed)
[facility] MD Progress Note  11/23/2015 11:48 AM Danielle Sweeney  MRN:  [id number] Subjective:  Patient with long history of Bipolar disorder, most recent episode depressed. Admitted with serious suicide attempt of cutting on self with multiple superficial lacerations. Reports she ran out of her medications, since she moved from [place] and has not been abl e to make any appointment with a psychiatrist yet. Per nursing staff, patient observed to be responding to internal stimuli . Reports not sleeping well last night, fair appetite.  Principal Problem: MDD Diagnosis:   Patient Active Problem List   Diagnosis Date Noted  . MDD (major depressive disorder) (HCC) [F32.9] 11/22/2015   Total Time spent with patient: 30 minutes  Past Psychiatric History:   Past Medical History:  Past Medical History:  Diagnosis Date  . Bipolar 1 disorder (HCC)   . Depression   . Suicide attempt [facility])     Past Surgical History:  Procedure Laterality Date  . BUNIONECTOMY    . LIPOMA EXCISION     Family History: History reviewed. No pertinent family history. Family Psychiatric  History:  Social History:  History  Alcohol Use  . Yes    Comment: occasional     History  Drug Use No    Social History   Social History  . Marital status: Legally Separated    Spouse name: N/A  . Number of children: N/A  . Years of education: N/A   Social History Main Topics  . Smoking status: Never Smoker  . Smokeless tobacco: Never Used  . Alcohol use Yes     Comment: occasional  . Drug use: No  . Sexual activity: Not Asked   Other Topics Concern  . None   Social History Narrative  . None   Additional Social History:                         Sleep: Fair  Appetite:  Fair  Current Medications: Current Facility-Administered Medications  Medication Dose Route Frequency Provider Last Rate Last Dose  . alum & mag hydroxide-simeth (MAALOX/MYLANTA) 200-200-20 MG/5ML suspension 30 mL  30 mL  Oral Q4H PRN Rockey Situ Cobos, MD      . ARIPiprazole (ABILIFY) tablet 2 mg  2 mg Oral Daily Molinda Bailiff, MD   2 mg at 11/23/15 4098  . benztropine (COGENTIN) tablet 1 mg  1 mg Oral BID Molinda Bailiff, MD   1 mg at 11/23/15 1191  . clonazePAM (KLONOPIN) tablet 0.5 mg  0.5 mg Oral BID PRN Molinda Bailiff, MD   0.5 mg at 11/22/15 1529  . escitalopram (LEXAPRO) tablet 20 mg  20 mg Oral Daily Molinda Bailiff, MD   20 mg at 11/23/15 0832  . ibuprofen (ADVIL,MOTRIN) tablet 400 mg  400 mg Oral TID PRN Molinda Bailiff, MD   400 mg at 11/22/15 2052  . lamoTRIgine (LAMICTAL) tablet 25 mg  25 mg Oral Daily Molinda Bailiff, MD   25 mg at 11/23/15 0831  . magnesium hydroxide (MILK OF MAGNESIA) suspension 30 mL  30 mL Oral Daily PRN Craige Cotta, MD      . paliperidone (INVEGA) 24 hr tablet 6 mg  6 mg Oral Daily Molinda Bailiff, MD   6 mg at 11/23/15 4782  . propranolol (INDERAL) tablet 10 mg  10 mg Oral Daily Molinda Bailiff, MD   10 mg at 11/23/15 0832  . traZODone (DESYREL) tablet 50 mg  50 mg Oral QHS,MR X 1 Court Joyharles E Kober, PA-C   50 mg at 11/22/15 2336  . zolpidem (AMBIEN) tablet 5 mg  5 mg Oral QHS Molinda BailiffUreh N Lekwauwa, MD   5 mg at 11/22/15 2223    Lab Results: No results found for this or any previous visit (from the past 48 hour(s)).  Blood Alcohol level:  Lab Results  Component Value Date   ETH 9 (H) 11/21/2015    Metabolic Disorder Labs: No results found for: HGBA1C, MPG No results found for: PROLACTIN No results found for: CHOL, TRIG, HDL, CHOLHDL, VLDL, LDLCALC  Physical Findings: AIMS: Facial and Oral Movements Muscles of Facial Expression: None, normal Lips and Perioral Area: None, normal Jaw: None, normal Tongue: None, normal,Extremity Movements Upper (arms, wrists, hands, fingers): None, normal Lower (legs, knees, ankles, toes): None, normal, Trunk Movements Neck, shoulders, hips: None, normal, Overall Severity Severity of abnormal movements (highest score from questions  above): None, normal Incapacitation due to abnormal movements: None, normal [name] awareness of abnormal movements (rate only patient's report): No Awareness, Dental Status Current problems with teeth and/or dentures?: No Does patient usually wear dentures?: No  CIWA:    COWS:     Musculoskeletal: Strength & Muscle Tone: within normal limits Gait & Station: normal Patient leans: N/A  Psychiatric Specialty Exam: Physical Exam  ROS  Blood pressure 122/76, pulse (!) 104, temperature 97.8 F (36.6 C), resp. rate 18, height 5\' 4"  (1.626 m), weight 202 lb (91.6 kg).Body mass index is 34.67 kg/m.  General Appearance: Casual  Eye Contact:  Fair  Speech:  Clear and Coherent  Volume:  Decreased  Mood:  Anxious and Depressed  Affect:  Constricted and Depressed  Thought Process:  Coherent  Orientation:  Full (Time, Place, and Person)  Thought Content:  WDL  Suicidal Thoughts:  No  Homicidal Thoughts:  No  Memory:  Immediate;   Fair Recent;   Fair Remote;   Fair  Judgement:  Impaired  Insight:  Present  Psychomotor Activity:  Decreased  Concentration:  Concentration: Fair and Attention Span: Fair  Recall:  FiservFair  Fund of Knowledge:  Fair  Language:  Fair  Akathisia:  No  Handed:  Right  AIMS (if indicated):     Assets:  Communication Skills Desire for Improvement Social Support  ADL's:  Intact  Cognition:  WNL  Sleep:  Number of Hours: 4.5     Treatment Plan Summary: Daily contact with patient to assess and evaluate symptoms and progress in treatment and Medication management   Major depressive Disorder with Psychotic features  Continue all current medications. Discontinue Abilify since patient is on Invega as well., no indication as yet  to have patient on 2 antipsychotics. Patient to engage in therapy and groups and develop coping skills to deal with her mood and improve emotional regulation. Monitor for safety and mood. Patient to be on close observation for  suicidal thoughts    Patrick NorthAVI, Lynden Carrithers, MD 11/23/2015, 11:48 AM

## 2015-11-24 DIAGNOSIS — F323 Major depressive disorder, single episode, severe with psychotic features: Secondary | ICD-10-CM | POA: Diagnosis not present

## 2015-11-24 MED ORDER — BACITRACIN-NEOMYCIN-POLYMYXIN 400-5-5000 EX OINT
TOPICAL_OINTMENT | CUTANEOUS | Status: DC | PRN
  Administered 2015-11-24 – 2015-11-25 (×2): 1 via TOPICAL
  Filled 2015-11-24 (×4): qty 1

## 2015-11-24 NOTE — BHH Group Notes (Signed)
Patient did not attend group.

## 2015-11-24 NOTE — BHH Group Notes (Signed)
[facility] Group Notes:  (Nursing/MHT/Case Management/Adjunct)  Date:  11/24/2015  Time:  10:59 AM  Type of Therapy:  Psychoeducational Skills  Participation Level:  Active  Participation Quality:  Appropriate  Affect:  Appropriate  Cognitive:  Appropriate  Insight:  Limited  Engagement in Group:  Engaged  Modes of Intervention:  Discussion and Education  Summary of Progress/Problems: Patient attended group and reports her support system is family.   Danielle Sweeney 11/24/2015, 10:59 AM

## 2015-11-24 NOTE — BHH Group Notes (Signed)
BHH LCSW Group Therapy  11/24/2015 10:10 - 11 AM  Type of Therapy:  Group Therapy  Participation Level:  Active  Participation Quality:  Appropriate  Affect:  Appropriate  Cognitive:  Alert and Oriented  Insight:  Developing/Improving  Engagement in Therapy:  Developing/Improving  Modes of Intervention:  Activity, Discussion, Exploration, Rapport Building, Socialization and Support  Summary of Progress/Problems:Pt engaged easily during group session. As patients processed their anxiety about discharge and described healthy supports patient  Shared her need to increase the number of people she sees as [place] and has not gotten out of the home much. 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 Pt offered support to mother of autistic child.    Carney Bernatherine C Harrill, LCSW

## 2015-11-24 NOTE — Progress Notes (Signed)
Patient ID: Danielle Sweeney, female   DOB: 1970-09-18, 45 y.o.   MRN: [id number]  DAR: Pt. Denies SI/HI and A/V Hallucinations. She reports sleep is fair, energy level is normal, appetite is good, and concentration is good. She rates depression 4/10, hopelessness 0/10, and anxiety 0/10.  Patient does report pain and stinging on her neck due to self inflicted laceration prior to admission. Ibuprofen provided to patient with no relief. Writer administered Neosporin to patient as well to help with wound healing on neck and left wrist. Support and encouragement provided to the patient to come to writer with questions or concerns. Scheduled medications administered to patient per physician's orders. Patient is minimal but cooperative. She is seen in the milieu throughout the day and is attending some groups. Q15 minute checks are maintained for safety.

## 2015-11-24 NOTE — BHH Group Notes (Signed)
[facility] Group Notes:  (Nursing/MHT/Case Management/Adjunct)  Date:  11/24/2015  Time:  2:52 PM  Type of Therapy:  Life skills   Participation Level:  Active  Participation Quality:  Appropriate  Affect:  Flat  Cognitive:  Appropriate  Insight:  Appropriate  Engagement in Group:  Engaged  Modes of Intervention:  Discussion and Education  Summary of Progress/Problems: The purpose of this group was to discuss life skills in relation to support systems.  

## 2015-11-24 NOTE — Progress Notes (Signed)
Patient ID: Danielle Sweeney, female   DOB: Jan 30, 1971, 45 y.o.   MRN: [id number] [facility]  11/24/2015 10:05 AM Danielle RalphsDorothy Morrisette-Treakle  MRN:  [id number] Subjective:  Patient with long history of Bipolar disorder, most recent episode depressed. Admitted with serious suicide attempt of cutting on self with multiple superficial lacerations. Reports she ran out of her medications, since she moved from [place] and has not been abl e to make any appointment with a psychiatrist yet. This morning patient reports feeling better , feels like her medications are kicking in. Her mood is better, denies any suicidal thoughts. Denies hearing voices. Reports not sleeping well again last night, fair appetite.  Principal Problem: MDD Diagnosis:   Patient Active Problem List   Diagnosis Date Noted  . MDD (major depressive disorder) (HCC) [F32.9] 11/22/2015   Total Time spent with patient: 30 minutes  Past Psychiatric History:   Past Medical History:  Past Medical History:  Diagnosis Date  . Bipolar 1 disorder (HCC)   . Depression   . Suicide attempt [facility](HCC)     Past Surgical History:  Procedure Laterality Date  . BUNIONECTOMY    . LIPOMA EXCISION     Family History: History reviewed. No pertinent family history. Family Psychiatric  History:  Social History:  History  Alcohol Use  . Yes    Comment: occasional     History  Drug Use No    Social History   Social History  . Marital status: Legally Separated    Spouse name: N/A  . Number of children: N/A  . Years of education: N/A   Social History Main Topics  . Smoking status: Never Smoker  . Smokeless tobacco: Never Used  . Alcohol use Yes     Comment: occasional  . Drug use: No  . Sexual activity: Not Asked   Other Topics Concern  . None   Social History Narrative  . None   Additional Social History:                         Sleep: Fair  Appetite:  Fair  Current Medications: Current  Facility-Administered Medications  Medication Dose Route Frequency Provider Last Rate Last Dose  . alum & mag hydroxide-simeth (MAALOX/MYLANTA) 200-200-20 MG/5ML suspension 30 mL  30 mL Oral Q4H PRN Rockey SituFernando A Cobos, MD      . benztropine (COGENTIN) tablet 1 mg  1 mg Oral BID Molinda BailiffUreh N Lekwauwa, MD   1 mg at 11/24/15 0810  . clonazePAM (KLONOPIN) tablet 0.5 mg  0.5 mg Oral BID PRN Molinda BailiffUreh N Lekwauwa, MD   0.5 mg at 11/24/15 0023  . escitalopram (LEXAPRO) tablet 20 mg  20 mg Oral Daily Molinda BailiffUreh N Lekwauwa, MD   20 mg at 11/24/15 0809  . ibuprofen (ADVIL,MOTRIN) tablet 400 mg  400 mg Oral TID PRN Molinda BailiffUreh N Lekwauwa, MD   400 mg at 11/24/15 0817  . lamoTRIgine (LAMICTAL) tablet 25 mg  25 mg Oral Daily Molinda BailiffUreh N Lekwauwa, MD   25 mg at 11/24/15 0810  . magnesium hydroxide (MILK OF MAGNESIA) suspension 30 mL  30 mL Oral Daily PRN Craige CottaFernando A Cobos, MD      . paliperidone (INVEGA) 24 hr tablet 6 mg  6 mg Oral Daily Molinda BailiffUreh N Lekwauwa, MD   6 mg at 11/24/15 0809  . propranolol (INDERAL) tablet 10 mg  10 mg Oral Daily Molinda BailiffUreh N Lekwauwa, MD   10 mg at 11/24/15 0810  .  traZODone (DESYREL) tablet 50 mg  50 mg Oral QHS,MR X 1 Court Joy, PA-C   50 mg at 11/24/15 0023  . zolpidem (AMBIEN) tablet 5 mg  5 mg Oral QHS Molinda Bailiff, MD   5 mg at 11/23/15 2244    Lab Results: No results found for this or any previous visit (from the past 48 hour(s)).  Blood Alcohol level:  Lab Results  Component Value Date   ETH 9 (H) 11/21/2015    Metabolic Disorder Labs: No results found for: HGBA1C, MPG No results found for: PROLACTIN No results found for: CHOL, TRIG, HDL, CHOLHDL, VLDL, LDLCALC  Physical Findings: AIMS: Facial and Oral Movements Muscles of Facial Expression: None, normal Lips and Perioral Area: None, normal Jaw: None, normal Tongue: None, normal,Extremity Movements Upper (arms, wrists, hands, fingers): None, normal Lower (legs, knees, ankles, toes): None, normal, Trunk Movements Neck, shoulders, hips: None,  normal, Overall Severity Severity of abnormal movements (highest score from questions above): None, normal Incapacitation due to abnormal movements: None, normal [name] awareness of abnormal movements (rate only patient's report): No Awareness, Dental Status Current problems with teeth and/or dentures?: No Does patient usually wear dentures?: No  CIWA:    COWS:     Musculoskeletal: Strength & Muscle Tone: within normal limits Gait & Station: normal Patient leans: N/A  Psychiatric Specialty Exam: Physical Exam  ROS  Blood pressure 114/60, pulse 86, temperature 98.3 F (36.8 C), resp. rate 20, height 5\' 4"  (1.626 m), weight 202 lb (91.6 kg).Body mass index is 34.67 kg/m.  General Appearance: Casual  Eye Contact:  Fair  Speech:  Clear and Coherent  Volume:  Decreased  Mood:  improving  Affect:  Constricted and Depressed  Thought Process:  Coherent  Orientation:  Full (Time, Place, and Person)  Thought Content:  WDL  Suicidal Thoughts:  No  Homicidal Thoughts:  No  Memory:  Immediate;   Fair Recent;   Fair Remote;   Fair  Judgement:  Impaired  Insight:  Present  Psychomotor Activity:  Decreased  Concentration:  Concentration: Fair and Attention Span: Fair  Recall:  [employer] of Knowledge:  Fair  Language:  Fair  Akathisia:  No  Handed:  Right  AIMS (if indicated):     Assets:  Communication Skills Desire for Improvement Social Support  ADL's:  Intact  Cognition:  WNL  Sleep:  Number of Hours: 5.25     Treatment Plan Summary: Daily contact with patient to assess and evaluate symptoms and progress in treatment and Medication management   Major depressive Disorder with Psychotic features  Continue all current medications. Discontinue Abilify on 11/23/15,  since patient is on Invega as well., no indication as yet  to have patient on 2 antipsychotics. Patient tolerating well. Patient to engage in therapy and groups and develop coping skills to deal with her mood  and improve emotional regulation. Monitor for safety and mood. Patient to be on close observation for suicidal thoughts    Joleen Stuckert, Georges Mouse, MD 11/24/2015, 10:05 AM

## 2015-11-24 NOTE — Progress Notes (Addendum)
D: Pt endorsed moderate neck pain from where she had cut herself. Pt at the time of assessment denied any form of depression, anxiety, SI, HI or AVH; states, "I am talking more now, I am watching TV, I'm feel much better."  Pt was however, flat and withdrawn to self even while in the dayroom. Pt remained calm and cooperative.  A: Medications offered as prescribed.  Support, encouragement, and safe environment provided.  15-minute safety checks continue. R: Pt was med compliant.  Pt attended wrap-up group. Safety checks continue.

## 2015-11-25 DIAGNOSIS — F329 Major depressive disorder, single episode, unspecified: Secondary | ICD-10-CM

## 2015-11-25 MED ORDER — ZOLPIDEM TARTRATE 10 MG PO TABS
10.0000 mg | ORAL_TABLET | Freq: Every day | ORAL | Status: DC
  Administered 2015-11-25: 10 mg via ORAL
  Filled 2015-11-25: qty 1

## 2015-11-25 NOTE — Progress Notes (Signed)
At the beginning of the shift, pt was in the dayroom watching TV.  She was waiting for her family who were [place] their way from [place] to see her.  She asked about them being able to visit with her and writer told her that if they came tonight before 9 pm, that they could visit for a short time.  Pt was pleased that she would be able to see them.  Pt denies SI/HI/AVH.  She voices no other needs or concerns at this time.  She feels her meds are helping her.  Support and encouragement offered.  Discharge plans are in process and pt plans to return home.  Safety maintained with q15 minute checks.

## 2015-11-25 NOTE — Progress Notes (Signed)
Patient ID: Danielle RalphsDorothy Whittier-Treakle, female   DOB: July 05, 1970, 45 y.o.   MRN: [id number]  DAR: Pt. Denies SI/HI and A/V Hallucinations. She reports sleep is fair, energy level is normal, appetite is good, and concentration is good. She rates depression 4/10 and anxiety 0/10. Patient does not report any pain or discomfort at this time. She reports her self inflicted cuts do not sting anymore. The cuts appear to be healing well. PRN Neosporin administered to patient. Support and encouragement provided to the patient. Scheduled medications administered to patient per physician's orders. Patient is minimal but cooperative. She is seen in the milieu and attending some groups. Q15 minute checks are maintained for safety.

## 2015-11-25 NOTE — Progress Notes (Signed)
Recreation Therapy Notes  Date: 11/25/15 Time: 0930 Location: 300 Hall Group Room  Group Topic: Stress Management  Goal Area(s) Addresses:  Patient will verbalize importance of using healthy stress management.  Patient will identify positive emotions associated with healthy stress management.   Intervention: Stress Management  Activity :  Guided Imagery.  LRT introduced the stress management technique of guided imagery to the patients.  LRT read Sweeney script so patients could participate in the activity.  Patients were to follow along as LRT read script to participate.  Education:  Stress Management, Discharge Planning.   Education Outcome: Needs additional education  Clinical Observations/Feedback: Pt did not attend group.   Danielle Sweeney, LRT/CTRS         Danielle Sweeney 11/25/2015 12:29 PM 

## 2015-11-25 NOTE — Progress Notes (Signed)
[facility] Progress Note  11/25/2015 5:42 PM Danielle RalphsDorothy Sweeney  MRN:  [id number]   Subjective:  Patient reports "  I am not resting well throughout the night with Ambien 5mg  and trazodone 50 mg combination."  Objective: Danielle Ralphsorothy Sweeney is awake, alert and oriented X4 , found attending group session.  Denies suicidal or homicidal ideation at this time. Denies auditory or visual hallucination and does not appear to be responding to internal stimuli. Patient reports she is medication compliant without mediation side effects. States her depression 3/10. Patient states "I am feeling better today"  Reports good appetite. Pt reports she is prdx Ambien 10 mg at home. Support, encouragement and reassurance was provided.   Principal Problem: MDD Diagnosis:   Patient Active Problem List   Diagnosis Date Noted  . MDD (major depressive disorder) (HCC) [F32.9] 11/22/2015   Total Time spent with patient: 30 minutes  Past Psychiatric History:   Past Medical History:  Past Medical History:  Diagnosis Date  . Bipolar 1 disorder (HCC)   . Depression   . Suicide attempt [facility](HCC)     Past Surgical History:  Procedure Laterality Date  . BUNIONECTOMY    . LIPOMA EXCISION     Family History: History reviewed. No pertinent family history. Family Psychiatric  History:  Social History:  History  Alcohol Use  . Yes    Comment: occasional     History  Drug Use No    Social History   Social History  . Marital status: Legally Separated    Spouse name: N/A  . Number of children: N/A  . Years of education: N/A   Social History Main Topics  . Smoking status: Never Smoker  . Smokeless tobacco: Never Used  . Alcohol use Yes     Comment: occasional  . Drug use: No  . Sexual activity: Not Asked   Other Topics Concern  . None   Social History Narrative  . None   Additional Social History:                         Sleep: Fair  Appetite:  Fair  Current  Medications: Current Facility-Administered Medications  Medication Dose Route Frequency Provider Last Rate Last Dose  . alum & mag hydroxide-simeth (MAALOX/MYLANTA) 200-200-20 MG/5ML suspension 30 mL  30 mL Oral Q4H PRN Rockey SituFernando A Cobos, MD      . benztropine (COGENTIN) tablet 1 mg  1 mg Oral BID Molinda BailiffUreh N Lekwauwa, MD   1 mg at 11/25/15 1650  . clonazePAM (KLONOPIN) tablet 0.5 mg  0.5 mg Oral BID PRN Molinda BailiffUreh N Lekwauwa, MD   0.5 mg at 11/25/15 0020  . escitalopram (LEXAPRO) tablet 20 mg  20 mg Oral Daily Molinda BailiffUreh N Lekwauwa, MD   20 mg at 11/25/15 0806  . ibuprofen (ADVIL,MOTRIN) tablet 400 mg  400 mg Oral TID PRN Molinda BailiffUreh N Lekwauwa, MD   400 mg at 11/24/15 1637  . lamoTRIgine (LAMICTAL) tablet 25 mg  25 mg Oral Daily Molinda BailiffUreh N Lekwauwa, MD   25 mg at 11/25/15 0806  . magnesium hydroxide (MILK OF MAGNESIA) suspension 30 mL  30 mL Oral Daily PRN Craige CottaFernando A Cobos, MD      . neomycin-bacitracin-polymyxin (NEOSPORIN) ointment   Topical PRN Patrick NorthHimabindu Ravi, MD   1 application at 11/25/15 0807  . paliperidone (INVEGA) 24 hr tablet 6 mg  6 mg Oral Daily Molinda BailiffUreh N Lekwauwa, MD   6 mg at 11/25/15 0806  .  propranolol (INDERAL) tablet 10 mg  10 mg Oral Daily Molinda Bailiff, MD   10 mg at 11/25/15 0806  . traZODone (DESYREL) tablet 50 mg  50 mg Oral QHS,MR X 1 Court Joy, PA-C   50 mg at 11/25/15 0019  . zolpidem (AMBIEN) tablet 5 mg  5 mg Oral QHS Molinda Bailiff, MD   5 mg at 11/24/15 2305    Lab Results: No results found for this or any previous visit (from the past 48 hour(s)).  Blood Alcohol level:  Lab Results  Component Value Date   ETH 9 (H) 11/21/2015    Metabolic Disorder Labs: No results found for: HGBA1C, MPG No results found for: PROLACTIN No results found for: CHOL, TRIG, HDL, CHOLHDL, VLDL, LDLCALC  Physical Findings: AIMS: Facial and Oral Movements Muscles of Facial Expression: None, normal Lips and Perioral Area: None, normal Jaw: None, normal Tongue: None, normal,Extremity  Movements Upper (arms, wrists, hands, fingers): None, normal Lower (legs, knees, ankles, toes): None, normal, Trunk Movements Neck, shoulders, hips: None, normal, Overall Severity Severity of abnormal movements (highest score from questions above): None, normal Incapacitation due to abnormal movements: None, normal [name] awareness of abnormal movements (rate only patient's report): No Awareness, Dental Status Current problems with teeth and/or dentures?: No Does patient usually wear dentures?: No  CIWA:    COWS:     Musculoskeletal: Strength & Muscle Tone: within normal limits Gait & Station: normal Patient leans: N/A  Psychiatric Specialty Exam: Physical Exam  Vitals reviewed. Constitutional: She appears well-developed.  HENT:  Head: Normocephalic.  Cardiovascular: Normal rate.   Neurological: She is alert.  Psychiatric: She has a normal mood and affect. Her behavior is normal.    Review of Systems  Psychiatric/Behavioral: Positive for depression and suicidal ideas. The patient is nervous/anxious.     Blood pressure 111/68, pulse 86, temperature 99.5 F (37.5 C), temperature source Oral, resp. rate 18, height 5\' 4"  (1.626 m), weight 91.6 kg (202 lb).Body mass index is 34.67 kg/m.  General Appearance: Casual  Eye Contact:  Fair  Speech:  Clear and Coherent  Volume:  Decreased  Mood:  improving  Affect:  Constricted and Depressed  Thought Process:  Coherent  Orientation:  Full (Time, Place, and Person)  Thought Content:  WDL  Suicidal Thoughts:  No  Homicidal Thoughts:  No  Memory:  Immediate;   Fair Recent;   Fair Remote;   Fair  Judgement:  Impaired  Insight:  Present  Psychomotor Activity:  Decreased  Concentration:  Concentration: Fair and Attention Span: Fair  Recall:  [employer] of Knowledge:  Fair  Language:  Fair  Akathisia:  No  Handed:  Right  AIMS (if indicated):     Assets:  Communication Skills Desire for Improvement Social Support  ADL's:   Intact  Cognition:  WNL  Sleep:  Number of Hours: 6.25     I agree with current treatment plan on 11/25/2015, Patient seen face-to-face for psychiatric evaluation follow-up, chart reviewed. Reviewed the information documented and agree with the treatment plan.  Treatment Plan Summary: Daily contact with patient to assess and evaluate symptoms and progress in treatment and Medication management  Discontinue trazodone 50 mg for insomnia  Increased Ambien 5 to 10 mg for insomnia  Major depressive Disorder with Psychotic features Continue all current medications. Discontinue Abilify on 11/23/15,  since patient is on Invega as well., no indication as yet  to have patient on 2 antipsychotics. Patient tolerating well. Patient  to engage in therapy and groups and develop coping skills to deal with her mood and improve emotional regulation. Monitor for safety and mood. Patient to be on close observation for suicidal thoughts  Oneta Rack, NP 11/25/2015, 5:42 PM

## 2015-11-26 DIAGNOSIS — F323 Major depressive disorder, single episode, severe with psychotic features: Secondary | ICD-10-CM | POA: Diagnosis not present

## 2015-11-26 MED ORDER — ZOLPIDEM TARTRATE 5 MG PO TABS: 5.0000 mg | ORAL_TABLET | Freq: Every evening | ORAL | Status: DC | PRN

## 2015-11-26 MED ORDER — ZOLPIDEM TARTRATE 10 MG PO TABS
10.0000 mg | ORAL_TABLET | Freq: Once | ORAL | Status: AC
  Administered 2015-11-26: 10 mg via ORAL
  Filled 2015-11-26: qty 1

## 2015-11-26 NOTE — Progress Notes (Signed)
Adult Psychoeducational Group Note  Date:  11/26/2015 Time:  0845  Group Topic/Focus:  Recovery Goals:   The focus of this group is to identify appropriate goals for recovery and establish a plan to achieve them.   Participation Level:  Minimal  Participation Quality:  Appropriate  Affect:  Appropriate  Cognitive:  Appropriate  Insight: Appropriate  Engagement in Group:  Limited  Modes of Intervention:  Discussion, Education and Orientation  Additional Comments:  Worked on crisis plan. Angalena Cousineau L 11/26/2015, 3:34 PM

## 2015-11-26 NOTE — Progress Notes (Addendum)
Patient ID: Danielle Sweeney, female   DOB: Jun 01, 1970, 45 y.o.   MRN: [id number] [facility] MD Progress Note  11/26/2015 1:53 PM Danielle Sweeney  MRN:  [id number] Subjective:  Patient reports she has been diagnosed with Bipolar Disorder in the past. States she had been stable and doing well on medications for a period of time. She recently [place] of state [place] this area, in order to live with her mother. In the context of this move, she ran out of her prescribed psychiatric medications and was unable to renew them as she did not have a local psychiatrist. She developed worsening depression and impulsively cut herself prior to admission . At this time reports she is starting to feel better, " more normal ", and feels her mood is normalizing . Denies medication  Side effects. Objective : I have discussed case with treatment team and have met with patient . Patient reports improving mood and feeling less depressed . Endorses some ongoing depression but overall states she is much improved compared to admission and at this time she is future oriented, focusing on discharge planning and on needing to be referred for local outpatient psychiatric treatment on discharge . Denies medication  Side effects- we reviewed side effects to include possible chronic movement disorders , TD, risk of severe rashes on Lamictal. States she has been on these medications prior, denies side effects and at this time feels they are well tolerated and helping her to stabilize . She is visible in unit, some group attendance, no disruptive or agitated behaviors    Reports not sleeping well again last night, fair appetite.  Principal Problem: MDD Diagnosis:   Patient Active Problem List   Diagnosis Date Noted  . MDD (major depressive disorder) ([place]) [F32.9] 11/22/2015   Total Time spent with patient: 20 minutes   Past Psychiatric History:   Past Medical History:  Past Medical History:  Diagnosis Date  .  Bipolar 1 disorder ([employer])   . Depression   . Suicide attempt [facility])     Past Surgical History:  Procedure Laterality Date  . BUNIONECTOMY    . LIPOMA EXCISION     Family History: History reviewed. No pertinent family history. Family Psychiatric  History:  Social History:  History  Alcohol Use  . Yes    Comment: occasional     History  Drug Use No    Social History   Social History  . Marital status: Legally Separated    Spouse name: N/A  . Number of children: N/A  . Years of education: N/A   Social History Main Topics  . Smoking status: Never Smoker  . Smokeless tobacco: Never Used  . Alcohol use Yes     Comment: occasional  . Drug use: No  . Sexual activity: Not Asked   Other Topics Concern  . None   Social History Narrative  . None   Additional Social History:   Sleep: improved   Appetite:  improved   Current Medications: Current Facility-Administered Medications  Medication Dose Route Frequency Provider Last Rate Last Dose  . alum & mag hydroxide-simeth (MAALOX/MYLANTA) 200-200-20 MG/5ML suspension 30 mL  30 mL Oral Q4H PRN Jenne Campus, MD   30 mL at 11/26/15 0803  . benztropine (COGENTIN) tablet 1 mg  1 mg Oral BID Sueanne Margarita, MD   1 mg at 11/26/15 0803  . clonazePAM (KLONOPIN) tablet 0.5 mg  0.5 mg Oral BID PRN Sueanne Margarita, MD   0.5 mg  at 11/25/15 2248  . escitalopram (LEXAPRO) tablet 20 mg  20 mg Oral Daily Sueanne Margarita, MD   20 mg at 11/26/15 0802  . ibuprofen (ADVIL,MOTRIN) tablet 400 mg  400 mg Oral TID PRN Sueanne Margarita, MD   400 mg at 11/24/15 1637  . lamoTRIgine (LAMICTAL) tablet 25 mg  25 mg Oral Daily Sueanne Margarita, MD   25 mg at 11/26/15 0803  . magnesium hydroxide (MILK OF MAGNESIA) suspension 30 mL  30 mL Oral Daily PRN Jenne Campus, MD      . neomycin-bacitracin-polymyxin (NEOSPORIN) ointment   Topical PRN Elvin So, MD   1 application at 30/07/62 0807  . paliperidone (INVEGA) 24 hr tablet 6 mg  6 mg Oral Daily  Sueanne Margarita, MD   6 mg at 11/26/15 0803  . propranolol (INDERAL) tablet 10 mg  10 mg Oral Daily Sueanne Margarita, MD   10 mg at 11/26/15 0802  . zolpidem (AMBIEN) tablet 10 mg  10 mg Oral QHS Derrill Center, NP   10 mg at 11/25/15 2248    Lab Results: No results found for this or any previous visit (from the past 29 hour(s)).  Blood Alcohol level:  Lab Results  Component Value Date   ETH 9 (H) 26/33/3545    Metabolic Disorder Labs: No results found for: HGBA1C, MPG No results found for: PROLACTIN No results found for: CHOL, TRIG, HDL, CHOLHDL, VLDL, LDLCALC  Physical Findings: AIMS: Facial and Oral Movements Muscles of Facial Expression: None, normal Lips and Perioral Area: None, normal Jaw: None, normal Tongue: None, normal,Extremity Movements Upper (arms, wrists, hands, fingers): None, normal Lower (legs, knees, ankles, toes): None, normal, Trunk Movements Neck, shoulders, hips: None, normal, Overall Severity Severity of abnormal movements (highest score from questions above): None, normal Incapacitation due to abnormal movements: None, normal [name] awareness of abnormal movements (rate only patient's report): No Awareness, Dental Status Current problems with teeth and/or dentures?: No Does patient usually wear dentures?: No  CIWA:    COWS:     Musculoskeletal: Strength & Muscle Tone: within normal limits Gait & Station: normal Patient leans: N/A  Psychiatric Specialty Exam: Physical Exam  ROS denies headache, no chest pain, no shortness of breath, no vomiting   Blood pressure 118/84, pulse 100, temperature 98.6 F (37 C), temperature source Oral, resp. rate 16, height 5' 4" (1.626 m), weight 202 lb (91.6 kg).Body mass index is 34.67 kg/m.  General Appearance: Well Groomed  Eye Contact:  Good  Speech:  Normal Rate  Volume:  Normal  Mood:  Less depressed, improving compared to admission   Affect:  Somewhat constricted,   Thought Process:  Goal Directed   Orientation:  Full (Time, Place, and Person)  Thought Content:  denies hallucinations, no delusions expressed, does not appear internally preoccupied   Suicidal Thoughts:  No at this time denies suicidal or self injurious ideations   Homicidal Thoughts:  No  Memory:  Recent and remote grossly intact   Judgement:  Fair   Insight:  Fair   Psychomotor Activity:  Normal  Concentration:  Concentration: Good and Attention Span: Good  Recall:  Good  Fund of Knowledge:  Good  Language:  Good  Akathisia:  No  Handed:  Right  AIMS (if indicated):     Assets:  Communication Skills Desire for Improvement Social Support  ADL's:  Intact  Cognition:  WNL  Sleep:  Number of Hours: 6.25   Assessment - patient reports  improving mood and states she is feeling better- no suicidal ideations or self injurious ideations at this time, attributes decompensation to running out of her psychiatric medications following recent relocation to [place] from out of state. She reports medications are well tolerated and effective .  She presents vaguely constricted in affect, denies any SI at this time   Treatment Plan Summary: Daily contact with patient to assess and evaluate symptoms and progress in treatment and Medication management  Encourage ongoing group, milieu participation to work on coping skills and symptom reduction  Continue Lamictal 25 mgrs QDAY for mood disorder  Continue Invega 6 mgrs QDAY for mood disorder Continue Lexapro 20 mgrs QDAY for depression, anxiety  Continue Klonopin 0.5 mgrs BID PRN for anxiety as needed Continue Cogentin 1 mgr BID to  Minimize risk of EPS   Continue Ambien 10 mgrs QHS PRN for insomnia as needed  Treatment team working on disposition planning options  Request TSH , and also repeat AST, ALT , to follow up an mildly elevated transaminases on admission      Neita Garnet, MD 11/26/2015, 1:53 PM

## 2015-11-26 NOTE — Tx Team (Signed)
Interdisciplinary Treatment and Diagnostic Plan Update  11/26/2015 Time of Session: 9:11 AM  Danielle RalphsDorothy Sweeney MRN: [id number]  Principal Diagnosis: Bipolar disorder, most recent episode depressed, severe without psychosis  Secondary Diagnoses: Active Problems:   MDD (major depressive disorder) (HCC)   Current Medications:  Current Facility-Administered Medications  Medication Dose Route Frequency Provider Last Rate Last Dose  . alum & mag hydroxide-simeth (MAALOX/MYLANTA) 200-200-20 MG/5ML suspension 30 mL  30 mL Oral Q4H PRN Craige CottaFernando A Cobos, MD   30 mL at 11/26/15 0803  . benztropine (COGENTIN) tablet 1 mg  1 mg Oral BID Molinda BailiffUreh N Lekwauwa, MD   1 mg at 11/26/15 0803  . clonazePAM (KLONOPIN) tablet 0.5 mg  0.5 mg Oral BID PRN Molinda BailiffUreh N Lekwauwa, MD   0.5 mg at 11/25/15 2248  . escitalopram (LEXAPRO) tablet 20 mg  20 mg Oral Daily Molinda BailiffUreh N Lekwauwa, MD   20 mg at 11/26/15 0802  . ibuprofen (ADVIL,MOTRIN) tablet 400 mg  400 mg Oral TID PRN Molinda BailiffUreh N Lekwauwa, MD   400 mg at 11/24/15 1637  . lamoTRIgine (LAMICTAL) tablet 25 mg  25 mg Oral Daily Molinda BailiffUreh N Lekwauwa, MD   25 mg at 11/26/15 0803  . magnesium hydroxide (MILK OF MAGNESIA) suspension 30 mL  30 mL Oral Daily PRN Craige CottaFernando A Cobos, MD      . neomycin-bacitracin-polymyxin (NEOSPORIN) ointment   Topical PRN Patrick NorthHimabindu Ravi, MD   1 application at 11/25/15 0807  . paliperidone (INVEGA) 24 hr tablet 6 mg  6 mg Oral Daily Molinda BailiffUreh N Lekwauwa, MD   6 mg at 11/26/15 0803  . propranolol (INDERAL) tablet 10 mg  10 mg Oral Daily Molinda BailiffUreh N Lekwauwa, MD   10 mg at 11/26/15 0802  . zolpidem (AMBIEN) tablet 10 mg  10 mg Oral QHS Oneta Rackanika N Lewis, NP   10 mg at 11/25/15 2248    PTA Medications: Prescriptions Prior to Admission  Medication Sig Dispense Refill Last Dose  . ARIPiprazole (ABILIFY) 2 MG tablet Take 2 mg by mouth daily.   11/01/2015  . benztropine (COGENTIN) 1 MG tablet Take 1 mg by mouth 2 (two) times daily.   11/01/2015  . clonazePAM (KLONOPIN) 1 MG  tablet Take 1.5 mg by mouth 2 (two) times daily as needed for anxiety.   11/01/2015  . escitalopram (LEXAPRO) 20 MG tablet Take 20 mg by mouth daily.   11/01/2015  . lamoTRIgine (LAMICTAL) 200 MG tablet Take 200 mg by mouth 2 (two) times daily.   11/01/2015  . paliperidone (INVEGA) 9 MG 24 hr tablet Take 9 mg by mouth daily.   11/01/2015  . propranolol (INDERAL) 10 MG tablet Take 10 mg by mouth daily.   11/01/2015  . zolpidem (AMBIEN) 10 MG tablet Take 10 mg by mouth at bedtime.   11/01/2015    Treatment Modalities: Medication Management, Group therapy, Case management,  1 to 1 session with clinician, Psychoeducation, Recreational therapy.   Physician Treatment Plan for Primary Diagnosis: Bipolar disorder, most recent episode depressed, severe without psychosis Long Term Goal(s): Improvement in symptoms so as ready for discharge  Short Term Goals: Ability to verbalize feelings will improve, Ability to disclose and discuss suicidal ideas and Compliance with prescribed medications will improve  Medication Management: Evaluate patient's response, side effects, and tolerance of medication regimen.  Therapeutic Interventions: 1 to 1 sessions, Unit Group sessions and Medication administration.  Evaluation of Outcomes: Progressing  Physician Treatment Plan for Secondary Diagnosis: Active Problems:   MDD (major depressive disorder) (  HCC)   Long Term Goal(s): Improvement in symptoms so as ready for discharge  Short Term Goals: Ability to verbalize feelings will improve, Ability to disclose and discuss suicidal ideas and Compliance with prescribed medications will improve  Medication Management: Evaluate patient's response, side effects, and tolerance of medication regimen.  Therapeutic Interventions: 1 to 1 sessions, Unit Group sessions and Medication administration.  Evaluation of Outcomes: Progressing   RN Treatment Plan for Primary Diagnosis:Bipolar disorder, most recent episode depressed,  severe without psychosis Long Term Goal(s): Knowledge of disease and therapeutic regimen to maintain health will improve  Short Term Goals: Ability to verbalize feelings will improve, Ability to disclose and discuss suicidal ideas and Ability to identify and develop effective coping behaviors will improve  Medication Management: RN will administer medications as ordered by provider, will assess and evaluate patient's response and provide education to patient for prescribed medication. RN will report any adverse and/or side effects to prescribing provider.  Therapeutic Interventions: 1 on 1 counseling sessions, Psychoeducation, Medication administration, Evaluate responses to treatment, Monitor vital signs and CBGs as ordered, Perform/monitor CIWA, COWS, AIMS and Fall Risk screenings as ordered, Perform wound care treatments as ordered.  Evaluation of Outcomes: Progressing   LCSW Treatment Plan for Primary Diagnosis: Bipolar disorder, most recent episode depressed, severe without psychosis Long Term Goal(s): Safe transition to appropriate next level of care at discharge, Engage patient in therapeutic group addressing interpersonal concerns.  Short Term Goals: Engage patient in aftercare planning with referrals and resources, Increase social support and Increase skills for wellness and recovery  Therapeutic Interventions: Assess for all discharge needs, 1 to 1 time with Social worker, Explore available resources and support systems, Assess for adequacy in community support network, Educate family and significant other(s) on suicide prevention, Complete Psychosocial Assessment, Interpersonal group therapy.  Evaluation of Outcomes: Progressing   Progress in Treatment: Attending groups: Yes Participating in groups: Yes Taking medication as prescribed: Yes, MD continues to assess for medication changes as needed Toleration medication: Yes, no side effects reported at this time Family/Significant  other contact made: No, CSW attempting to make contact with mother Patient understands diagnosis: Continuing to assess Discussing patient identified problems/goals with staff: Yes Medical problems stabilized or resolved: Yes Denies suicidal/homicidal ideation: Yes Issues/concerns per patient self-inventory: None Other: N/A  New problem(s) identified: None identified at this time.   New Short Term/Long Term Goal(s): None identified at this time.   Discharge Plan or Barriers: Pt will return home and follow-up with outpatient services.   Reason for Continuation of Hospitalization: Anxiety Depression Medication stabilization Suicidal ideation  Estimated Length of Stay: 1 day  Attendees: Patient: 11/26/2015  9:11 AM  Physician: Dr. Jama Flavors 11/26/2015  9:11 AM  Nursing: Midge Aver, Donzetta Starch ,RN 11/26/2015  9:11 AM  RN Care Manager: Onnie Boer, RN 11/26/2015  9:11 AM  Social Worker: Chad Cordial, LCSW; Kristin Drinkard, LCSW 11/26/2015  9:11 AM  Recreational Therapist:  11/26/2015  9:11 AM  Other: Armandina Stammer, NP; Gray Bernhardt, NP 11/26/2015  9:11 AM  Other:  11/26/2015  9:11 AM  Other: 11/26/2015  9:11 AM    Scribe for Treatment Team: Elaina Hoops, LCSW 11/26/2015 9:11 AM

## 2015-11-26 NOTE — Progress Notes (Signed)
Adult Psychoeducational Group Note  Date:  11/26/2015 Time:  11:57 PM  Group Topic/Focus:  Wrap-Up Group:   The focus of this group is to help patients review their daily goal of treatment and discuss progress on daily workbooks.   Participation Level:  Active  Participation Quality:  Appropriate  Affect:  Appropriate  Cognitive:  Alert, Appropriate and Oriented  Insight: Appropriate  Engagement in Group:  Engaged  Modes of Intervention:  Discussion  Additional Comments:  Patient attended group and said that her day was a 9.  Her goal for today was to meet her physician and she did.  Her coping skill is stay in the moment and praying.  Danielle Sweeney W Moranda Billiot 11/26/2015, 11:57 PM

## 2015-11-26 NOTE — [facility] Group Notes (Signed)
[facility] LCSW Group Therapy 11/26/2015 1:15 PM  Type of Therapy: Group Therapy- Feelings about Diagnosis  Participation Level: Reserved; Minimal  Participation Quality:  Appropriate  Affect:  Appropriate  Cognitive: Alert and Oriented   Insight:  Developing   Engagement in Therapy: Developing/Improving  Modes of Intervention: Clarification, Confrontation, Discussion, Education, Exploration, Limit-setting, Orientation, Problem-solving, Rapport Building, Dance movement psychotherapisteality Testing, Socialization and Support  Description of Group:   This group will allow patients to explore their thoughts and feelings about diagnoses they have received. Patients will be guided to explore their level of understanding and acceptance of these diagnoses. Facilitator will encourage patients to process their thoughts and feelings about the reactions of others to their diagnosis, and will guide patients in identifying ways to discuss their diagnosis with significant others in their lives. This group will be process-oriented, with patients participating in exploration of their own experiences as well as giving and receiving support and challenge from other group members.  Summary of Progress/Problems:  Pt participated minimally in group discussion but remained attentive throughout AEB offering nonverbal affirming gestures.   Therapeutic Modalities:   Cognitive Behavioral Therapy Solution Focused Therapy Motivational Interviewing Relapse Prevention Therapy  Chad CordialLauren Carter, LCSWA 11/26/2015 4:13 PM

## 2015-11-26 NOTE — Progress Notes (Signed)
Pt reports she has been fine today.  She denies SI/HI/AVH.  She says she has attended groups today.  She was glad to see her daughters last night, but says that they had to go back to [place] today.  She denies pain and voiced no other needs or concerns this evening.  She is unsure of when she will discharge, but plans to return home at that time.  Pt makes her needs known to staff.  She is glad that the provider increased her Ambien to 10 mg and hopes to rest better tonight.  Support and encouragement offered. Discharge plans are in process.  Safety maintained with q15 minute checks.

## 2015-11-26 NOTE — Progress Notes (Signed)
D: Pt presents with flat affect and depressed mood. Pt have minimal interaction and forwards little information. Pt denies AVH and do not appear to be responding to internal stimuli. Pt denies mood swings. Pt rates depression 2/10. Pt reports fair sleep last. Pt denies suicidal thoughts and verbally contracts for safety. Pt compliant with attending groups and taking meds. No side effects to meds verbalized by pt. A: Medications reviewed with pt. Medications administered as ordered per MD. Verbal support provided. Pt encouraged to attend groups. 15 minute checks performed for safety. R: Pt receptive to tx. Pt verbalizes understanding of med regimen.

## 2015-11-26 NOTE — Progress Notes (Signed)
Recreation Therapy Notes  Animal-Assisted Activity (AAA) Program Checklist/Progress Notes Patient Eligibility Criteria Checklist & Daily Group note for Rec TxIntervention  Date: 09.05.2017 Time: 2:45pm [facility] [address]    AAA/T Program Assumption of Risk Form signed by Patient/ or Parent Legal Guardian Yes  Patient is free of allergies or sever asthma Yes  Patient reports no fear of animals Yes  Patient reports no history of cruelty to animals Yes  Patient understands his/her participation is voluntary Yes  Behavioral Response: Did not attend.  Marykay Lexenise L Christon Gallaway, LRT/CTRS  Vaness Jelinski L 11/26/2015 3:06 PM

## 2015-11-27 DIAGNOSIS — F323 Major depressive disorder, single episode, severe with psychotic features: Secondary | ICD-10-CM | POA: Diagnosis not present

## 2015-11-27 LAB — HEPATIC FUNCTION PANEL
ALK PHOS: 106 U/L (ref 38–126)
ALT: 83 U/L — ABNORMAL HIGH (ref 14–54)
AST: 57 U/L — ABNORMAL HIGH (ref 15–41)
Albumin: 4.1 g/dL (ref 3.5–5.0)
BILIRUBIN INDIRECT: 0.4 mg/dL (ref 0.3–0.9)
Bilirubin, Direct: 0.1 mg/dL (ref 0.1–0.5)
TOTAL PROTEIN: 7.7 g/dL (ref 6.5–8.1)
Total Bilirubin: 0.5 mg/dL (ref 0.3–1.2)

## 2015-11-27 LAB — TSH: TSH: 1.663 u[IU]/mL (ref 0.350–4.500)

## 2015-11-27 MED ORDER — LAMOTRIGINE 25 MG PO TABS: 25.0000 mg | ORAL_TABLET | Freq: Every day | ORAL | 0 refills | Status: DC

## 2015-11-27 MED ORDER — PROPRANOLOL HCL 10 MG PO TABS: 10.0000 mg | ORAL_TABLET | Freq: Every day | ORAL | 0 refills | Status: DC

## 2015-11-27 MED ORDER — ZOLPIDEM TARTRATE 5 MG PO TABS: 5.0000 mg | ORAL_TABLET | Freq: Every evening | ORAL | 0 refills | Status: DC | PRN

## 2015-11-27 MED ORDER — BENZTROPINE MESYLATE 1 MG PO TABS: 1.0000 mg | ORAL_TABLET | Freq: Two times a day (BID) | ORAL | 0 refills | Status: DC

## 2015-11-27 MED ORDER — PALIPERIDONE ER 6 MG PO TB24: 6.0000 mg | ORAL_TABLET | Freq: Every day | ORAL | 0 refills | Status: DC

## 2015-11-27 MED ORDER — ESCITALOPRAM OXALATE 20 MG PO TABS: 20.0000 mg | ORAL_TABLET | Freq: Every day | ORAL | 0 refills | Status: DC

## 2015-11-27 NOTE — [facility] Suicide Risk Assessment (Signed)
[facility] INPATIENT:  Family/Significant Other Suicide Prevention Education  Suicide Prevention Education:  Education Completed; Danielle Sweeney, Pt's mother [phone],  has been identified by the patient as the family member/significant other with whom the patient will be residing, and identified as the person(s) who will aid the patient in the event of a mental health crisis (suicidal ideations/suicide attempt).  With written consent from the patient, the family member/significant other has been provided the following suicide prevention education, prior to the and/or following the discharge of the patient.  The suicide prevention education provided includes the following:  Suicide risk factors  Suicide prevention and interventions  National Suicide Hotline telephone number  [facility] assessment telephone number  [facility] Emergency Assistance 911  [facility] and/or Residential Mobile Crisis Unit telephone number  Request made of family/significant other to:  Remove weapons (e.g., guns, rifles, knives), all items previously/currently identified as safety concern.    Remove drugs/medications (over-the-counter, prescriptions, illicit drugs), all items previously/currently identified as a safety concern.  The family member/significant other verbalizes understanding of the suicide prevention education information provided.  The family member/significant other agrees to remove the items of safety concern listed above.  Elaina HoopsLauren M Carter 11/27/2015, 10:14 AM

## 2015-11-27 NOTE — Progress Notes (Signed)
Pt reports that her day has been fine.  She says that she spoke with the MD about her medications and she told him that she wanted to keep things as they were.  We discovered that he had decreased her Ambien back to 5 mg and she did not understand why as she takes 10 mg at home.  Writer spoke to PA who gave a one time order for her to receive the 10 mg and encouraged her to speak to the doctor about the change in the morning.  She denies SI/HI/AVH.  She states that she will probably discharge tomorrow on Wednesday.  She voiced no other needs or concerns.  Pt has been pleasant and cooperative with staff and peers.  Support and encouragement offered.  Discharge plans are in process.  Safety maintained with q15 minute checks.

## 2015-11-27 NOTE — [facility] Suicide Risk Assessment (Addendum)
[facility] Discharge Suicide Risk Assessment   Principal Problem: Depression  Discharge Diagnoses:  Patient Active Problem List   Diagnosis Date Noted  . MDD (major depressive disorder) (HCC) [F32.9] 11/22/2015    Total Time spent with patient: 30 minutes  Musculoskeletal: Strength & Muscle Tone: within normal limits Gait & Station: normal Patient leans: N/A  Psychiatric Specialty Exam: ROS denies headache, no chest pain, no shortness of breath, no RUQ pain, no choluria or acholia, no jaundice   Blood pressure 131/83, pulse 68, temperature 98.4 F (36.9 C), temperature source Oral, resp. rate 18, height 5\' 4"  (1.626 m), weight 202 lb (91.6 kg).Body mass index is 34.67 kg/m.  General Appearance: Well Groomed  Eye Contact::  Good  Speech:  Normal Rate409  Volume:  Normal  Mood:  improved mood, states she feels better today   Affect:  Appropriate and more reactive, smiles at times appropriately   Thought Process:  Linear  Orientation:  Full (Time, Place, and Person)  Thought Content:  denies hallucinations, no delusions   Suicidal Thoughts:  No- denies any suicidal or self injurious ideations   Homicidal Thoughts:  No  Memory:  recent and remote grossly intact   Judgement:  Other:  improved   Insight:  improved   Psychomotor Activity:  Normal  Concentration:  Good  Recall:  Good  Fund of Knowledge:Good  Language: Good  Akathisia:  Negative  Handed:  Right  AIMS (if indicated):   has occasional subtle buccal  movements during session  - states she is not aware of these, however , on AIMS test no abnormal involuntary movements were noted   Assets:  Desire for Improvement Resilience  Sleep:  Number of Hours: 6.5  Cognition: WNL  ADL's:  Intact   Mental Status Per Nursing Assessment::   On Admission:  Suicidal ideation indicated by patient, Suicide plan, Plan includes specific time, place, or method, Self-harm thoughts, Self-harm behaviors, Intention to act on suicide plan,  Belief that plan would result in death  Demographic Factors:  45 year old female, recently moved from [place] to [place]  Loss Factors:  recent relocation, had no local psychiatric services prior to admission , had run out of medications   Historical Factors: Has been diagnosed with Bipolar Disorder in the past , prior history of psychiatric admissions , history of past ECT treatment  Risk Reduction Factors:   Sense of responsibility to family, Living with another person, especially a relative and Positive coping skills or problem solving skills  Continued Clinical Symptoms:  At this time patient is alert , attentive, well related, pleasant, calm. Mood is described as improved and she minimizes depression at this time, affect is reactive, no thought disorder, no SI or HI, no psychotic symptoms and is future oriented. Denies medication side effects- we have reviewed medication side effect profiles, particularly risk of involuntary movement disorders/ TD from antipsychotic management   Cognitive Features That Contribute To Risk:  No gross cognitive deficits noted upon discharge. Is alert , attentive, and oriented x 3    Suicide Risk:  Mild:  Suicidal ideation of limited frequency, intensity, duration, and specificity.  There are no identifiable plans, no associated intent, mild dysphoria and related symptoms, good self-control (both objective and subjective assessment), few other risk factors, and identifiable protective factors, including available and accessible social support.  Follow-up Information    [facility]   Why:  CSW made referral on 9/6. Please contact Sabrina, intake coordinator, for your initial appointment.  Or you may utilize their walk-in hours which are Mon 9-12, Tues 12-3, and Thurs 8-11. Your care coordinator is [name] [phone]) Contact information: [address] [place] [address] [phone]           Plan Of Care/Follow-up recommendations:  Activity:   as tolerated  Diet:  Regular  Tests:  NA Other:  see below   Patient is leaving unit in good spirits  Plans to return home- currently staying with mother  Plans to follow up as above  * Of note, patient has mildly elevated LFTs - denies known history of liver disease or any associated symptoms- agrees to follow up with her PCP for ongoing monitoring , work up of this issue   Nehemiah MassedOBOS, Cristen Bredeson, MD 11/27/2015, 12:32 PM

## 2015-11-27 NOTE — Progress Notes (Signed)
  [name] :  Will you be returning to the same living situation after discharge:  Yes,  Pt returning home with family At discharge, do you have transportation home?: Yes,  mother to pick up Do you have the ability to pay for your medications: Yes,  Pt provided with supply and prescriptions  Release of information consent forms completed and in the chart;  [name] signature needed at discharge.  Patient to Follow up at: Follow-up Information    [facility]   Why:  CSW made referral on 9/6. Please contact Sabrina, intake coordinator, for your initial appointment. Or you may utilize their walk-in hours which are Mon 9-12, Tues 12-3, and Thurs 8-11. Your care coordinator is [name] [phone]) Contact information: [address] [place] [address] [phone]           Next level of care provider has access to [facility] Link:no  Safety Planning and Suicide Prevention discussed: Yes,  with mother; see SPE note  Have you used any form of tobacco in the last 30 days? (Cigarettes, Smokeless Tobacco, Cigars, and/or Pipes): No  Has patient been referred to the Quitline?: N/A patient is not a smoker  Patient has been referred for addiction treatment: N/A  Danielle Sweeney 11/27/2015, 10:16 AM

## 2015-11-27 NOTE — Progress Notes (Signed)
Recreation Therapy Notes  Date: 11/27/15 Time: 0930 Location: [address] Group Room  Group Topic: Stress Management  Goal Area(s) Addresses:  Patient will verbalize importance of using healthy stress management.  Patient will identify positive emotions associated with healthy stress management.   Behavioral Response: Engaged  Intervention: Stress Management  Activity :  Progressive Muscle Relaxation.  LRT introduced the technique of progressive muscle relaxation to patients.  LRT read script so patients to engage in technique.  Patients were to sit in a comfortable position and follow along as LRT read script.  Education:  Stress Management, Discharge Planning.   Education Outcome: Acknowledges edcuation/In group clarification offered/Needs additional education  Clinical Observations/Feedback: Pt attended group.    Caroll RancherMarjette Tejah Brekke, LRT/CTRS         Caroll RancherLindsay, Darrius Montano A 11/27/2015 12:19 PM

## 2015-11-27 NOTE — Progress Notes (Signed)
Patient ID: Maureen RalphsDorothy Veras-Treakle, female   DOB: Apr 22, 1970, 45 y.o.   MRN: [id number]  Pt. Denies SI/HI and A/V hallucinations. Belongings returned to patient at time of discharge. Patient denies any pain or discomfort. Discharge instructions and medications were reviewed with patient by Linton RumpAmanda Lawson RN. Patient verbalized understanding of both medications and discharge instructions. She reports feeling ready for discharge. Q15 minute safety checks maintained until time of discharge. No distress upon discharge.

## 2015-11-27 NOTE — Discharge Summary (Signed)
Physician Discharge Summary Note  Patient:  Danielle Sweeney is an 45 y.o., female MRN:  [id number] DOB:  05/26/1970 Patient phone:  [phone] (home)  Patient address:   [address] [place] [address] [address],  Total Time spent with patient: 30 minutes  Date of Admission:  11/21/2015 Date of Discharge: 11/28/2015  Reason for Admission:  Self injury  Principal Problem: MDD (major depressive disorder) [facility]) Discharge Diagnoses: Patient Active Problem List   Diagnosis Date Noted  . MDD (major depressive disorder) (HCC) [F32.9] 11/22/2015    Priority: High  . 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 Bipolar 1 disorder (HCC)   . Depression   . Suicide attempt [facility])     Past Surgical History:  Procedure Laterality Date  . BUNIONECTOMY    . LIPOMA EXCISION     Family History: History reviewed. No pertinent family history. Family Psychiatric  History: see HPI Social History:  History  Alcohol Use  . Yes    Comment: occasional     History  Drug Use No    Social History   Social History  . Marital status: Legally Separated    Spouse name: N/A  . Number of children: N/A  . Years of education: N/A   Social History Main Topics  . Smoking status: Never Smoker  . Smokeless tobacco: Never Used  . Alcohol use Yes     Comment: occasional  . Drug use: No  . Sexual activity: Not Asked   Other Topics Concern  . None   Social History Narrative  . None    Hospital Course:  Danielle Sweeney a 45 y.o.femalewith a history of Bipolar 1 disorder, depression and suicide attempt who presented to the Emergency Department due to multiple self inflicted lacerations w/o active bleeding to her neck and wrists.  Danielle Sweeney was admitted for MDD (major depressive disorder) (HCC) and  crisis management.  She was treated with medications with their indications listed below under Medication List.  Medical problems were identified and treated as needed.  Home medications were restarted as appropriate.  Improvement was monitored by observation and Danielle Sweeney daily report of symptom reduction.  Emotional and mental status was monitored by daily self inventory reports completed by Danielle Sweeney and clinical staff.  Patient reported continued improvement, denied any new concerns.  Patient had been compliant on medications and denied side effects.  Support and encouragement was provided.    Patient encouraged to attend groups to help with recognizing triggers of emotional crises and de-stabilizations.  Patient encouraged to attend group to help identify the positive things in life that would help in dealing with feelings of loss, depression and unhealthy or abusive tendencies.         Danielle Sweeney was evaluated by the treatment team for stability and plans for continued recovery upon discharge.  She was offered further treatment options upon discharge including Residential, Intensive Outpatient and Outpatient treatment.  She will follow up with agency listed below  for medication management and counseling.  Encouraged patient to maintain satisfactory support network and home environment.  Advised to adhere to medication compliance and outpatient treatment follow up.  Prescriptions provided.       Danielle Sweeney motivation was an integral factor for scheduling further treatment.  Employment, transportation, bed availability, health status, family support, and any  pending legal issues were also considered during her hospital stay.  Upon completion of this admission the patient was both mentally and medically stable for discharge denying suicidal/homicidal ideation, auditory/visual/tactile hallucinations, delusional thoughts and paranoia.      Physical  Findings: AIMS: Facial and Oral Movements Muscles of Facial Expression: None, normal Lips and Perioral Area: None, normal Jaw: None, normal Tongue: None, normal,Extremity Movements Upper (arms, wrists, hands, fingers): None, normal Lower (legs, knees, ankles, toes): None, normal, Trunk Movements Neck, shoulders, hips: None, normal, Overall Severity Severity of abnormal movements (highest score from questions above): None, normal Incapacitation due to abnormal movements: None, normal [name] awareness of abnormal movements (rate only patient's report): No Awareness, Dental Status Current problems with teeth and/or dentures?: No Does patient usually wear dentures?: No  CIWA:    COWS:     Musculoskeletal: Strength & Muscle Tone: within normal limits Gait & Station: normal Patient leans: N/A  Psychiatric Specialty Exam:  See MD SRA Physical Exam  Nursing note and vitals reviewed.   Review of Systems  Constitutional: Negative.   HENT: Negative.   Eyes: Negative.   Respiratory: Negative.   Cardiovascular: Negative.   Gastrointestinal: Negative.   Genitourinary: Negative.   Musculoskeletal: Negative.   Skin: Negative.   Neurological: Negative.   Endo/Heme/Allergies: Negative.   Psychiatric/Behavioral: Negative.   All other systems reviewed and are negative.   Blood pressure 134/80, pulse 70, temperature 98.6 F (37 C), resp. rate 18, height 5\' 4"  (1.626 m), weight 91.6 kg (202 lb).Body mass index is 34.67 kg/m.   Have you used any form of tobacco in the last 30 days? (Cigarettes, Smokeless Tobacco, Cigars, and/or Pipes): No  Has this patient used any form of tobacco in the last 30 days? (Cigarettes, Smokeless Tobacco, Cigars, and/or Pipes) Yes, N/A  Blood Alcohol level:  Lab Results  Component Value Date   ETH 9 (H) 11/21/2015    Metabolic Disorder Labs:  No results found for: HGBA1C, MPG No results found for: PROLACTIN No results found for: CHOL, TRIG, HDL,  CHOLHDL, VLDL, LDLCALC  See Psychiatric Specialty Exam and Suicide Risk Assessment completed by Attending Physician prior to discharge.  Discharge destination:  Home  Is patient on multiple antipsychotic therapies at discharge:  No   Has Patient had three or more failed trials of antipsychotic monotherapy by history:  No  Recommended Plan for Multiple Antipsychotic Therapies: NA     Medication List    STOP taking these medications   ARIPiprazole 2 MG tablet Commonly known as:  ABILIFY   clonazePAM 1 MG tablet Commonly known as:  KLONOPIN     TAKE these medications     Indication  benztropine 1 MG tablet Commonly known as:  COGENTIN Take 1 tablet (1 mg total) by mouth 2 (two) times daily.  Indication:  Extrapyramidal Reaction caused by Medications   escitalopram 20 MG tablet Commonly known as:  LEXAPRO Take 1 tablet (20 mg total) by mouth daily.  Indication:  Major Depressive Disorder   lamoTRIgine 25 MG tablet Commonly known as:  LAMICTAL Take 1 tablet (25 mg total) by mouth daily. What changed:  medication strength  how much to take  when to take this  Indication:  Manic-Depression   paliperidone 6 MG 24 hr tablet Commonly known as:  INVEGA Take 1 tablet (6 mg total) by mouth daily. What changed:  medication strength  how much to take  Indication:  Schizoaffective Disorder   propranolol 10 MG tablet Commonly known as:  INDERAL Take 1 tablet (10 mg total) by mouth daily.  Indication:  Anxiety Related to Current Life Problems   zolpidem 5 MG tablet Commonly known as:  AMBIEN Take 1 tablet (5 mg total) by mouth at bedtime as needed for sleep. What changed:  medication strength  how much to take  when to take this  reasons to take this  Indication:  Trouble Sleeping      Follow-up Information    [facility] .   Why:  CSW made referral on 9/6. Please contact Sabrina, intake coordinator, for your initial appointment. Or you may utilize their  walk-in hours which are Mon 9-12, Tues 12-3, and Thurs 8-11. Your care coordinator is [name] [phone]) Contact information: [address] [place] [address] [phone]           Follow-up recommendations:  Activity:  as tol Diet:  as tol  Comments:  1.  Take all your medications as prescribed.   2.  Report any adverse side effects to outpatient provider. 3.  Patient instructed to not use alcohol or illegal drugs while on prescription medicines. 4.  In the event of worsening symptoms, instructed patient to call 911, the crisis hotline or go to nearest emergency room for evaluation of symptoms.  Signed: Lindwood QuaSheila May Agustin, NP [facility] 11/28/2015, 8:40 AM   Patient seen, Suicide Assessment Completed.  Disposition Plan Reviewed

## 2016-10-22 DIAGNOSIS — F3176 Bipolar disorder, in full remission, most recent episode depressed: Secondary | ICD-10-CM | POA: Insufficient documentation

## 2016-10-22 DIAGNOSIS — E7800 Pure hypercholesterolemia, unspecified: Secondary | ICD-10-CM | POA: Insufficient documentation

## 2018-09-16 DIAGNOSIS — M961 Postlaminectomy syndrome, not elsewhere classified: Secondary | ICD-10-CM | POA: Insufficient documentation

## 2020-02-26 DIAGNOSIS — Z719 Counseling, unspecified: Secondary | ICD-10-CM | POA: Insufficient documentation

## 2020-02-26 DIAGNOSIS — K5904 Chronic idiopathic constipation: Secondary | ICD-10-CM | POA: Insufficient documentation

## 2020-04-18 LAB — HM COLONOSCOPY

## 2021-03-17 ENCOUNTER — Ambulatory Visit
Admission: EM | Admit: 2021-03-17 | Discharge: 2021-03-17 | Disposition: A | Discharge status: Home / Self Care | Payer: Medicare Other

## 2021-03-17 ENCOUNTER — Other Ambulatory Visit: Payer: Self-pay

## 2021-03-17 DIAGNOSIS — L02412 Cutaneous abscess of left axilla: Secondary | ICD-10-CM | POA: Diagnosis not present

## 2021-03-17 MED ORDER — HIBICLENS 4 % EX LIQD
Freq: Every day | CUTANEOUS | 0 refills | Status: DC | PRN
Start: 2021-03-17 — End: 2021-04-04

## 2021-03-17 MED ORDER — SULFAMETHOXAZOLE-TRIMETHOPRIM 800-160 MG PO TABS: 1.0000 | ORAL_TABLET | Freq: Two times a day (BID) | ORAL | 0 refills | Status: AC

## 2021-03-17 NOTE — ED Provider Notes (Signed)
[facility]    CSN: [id number] Arrival date & time: 03/17/21  1017      History   Chief Complaint Chief Complaint  Patient presents with   Abscess    HPI Danielle Sweeney is a 50 y.o. female.   Presenting today with several month history of waxing and waning left axillary abscess.  She states it has gotten much larger over the last week or so and significantly more painful.  She denies notice of active drainage, bleeding, fever, chills.  States that she thinks these come on when she shaves the area.  She has had numerous abscesses in this area in the past.  Has not been taking anything over-the-counter, tried Biofreeze on it but that seem to make it worse so she stopped.   Past Medical History:  Diagnosis Date   Bipolar 1 disorder (HCC)    Depression    Suicide attempt [facility])     Patient Active Problem List   Diagnosis Date Noted   Severe single current episode of major depressive disorder, with psychotic features (HCC)    MDD (major depressive disorder) 11/22/2015    Past Surgical History:  Procedure Laterality Date   BUNIONECTOMY     LIPOMA EXCISION      OB History   No obstetric history on file.      Home Medications    Prior to Admission medications   Medication Sig Start Date End Date Taking? Authorizing Provider  chlorhexidine (HIBICLENS) 4 % external liquid Apply topically daily as needed. 03/17/21  Yes Particia Nearing, PA-C  famotidine (PEPCID) 40 MG tablet Take by mouth. 05/28/20  Yes [provider]  lamoTRIgine (LAMICTAL) 100 MG tablet TAKE 2 TABLETS BY ORAL ROUTE IN THE MORNING AND 2 TABLETS AT BEDTIME 02/20/20  Yes [provider]  linaclotide Karlene Einstein) 290 MCG CAPS capsule Take by mouth. 02/07/21 05/08/21 Yes [provider]  LORazepam (ATIVAN) 1 MG tablet Take by mouth. 02/20/20  Yes [provider]  lumateperone tosylate (CAPLYTA) 42 MG capsule Take by mouth. 01/05/20  Yes [provider]  nicotine (NICODERM CQ - DOSED IN MG/24 HR) 7 mg/24hr patch Place onto the skin. 06/19/20  Yes [provider]  omeprazole (PRILOSEC) 40 MG capsule Take by mouth. 01/07/21 04/07/21 Yes [provider]  sulfamethoxazole-trimethoprim (BACTRIM DS) 800-160 MG tablet Take 1 tablet by mouth 2 (two) times daily for 7 days. 03/17/21 03/24/21 Yes Particia Nearing, PA-C  benztropine (COGENTIN) 1 MG tablet Take 1 tablet (1 mg total) by mouth 2 (two) times daily. 11/27/15   Adonis Brook, NP  cyanocobalamin 100 MCG tablet Take by mouth.    [provider]  escitalopram (LEXAPRO) 20 MG tablet Take 1 tablet (20 mg total) by mouth daily. 11/27/15   Adonis Brook, NP  lamoTRIgine (LAMICTAL) 25 MG tablet Take 1 tablet (25 mg total) by mouth daily. 11/27/15   Adonis Brook, NP  Multiple Vitamin (MULTIVITAMIN) capsule Take 1 capsule by mouth daily.    [provider]  paliperidone (INVEGA) 6 MG 24 hr tablet Take 1 tablet (6 mg total) by mouth daily. 11/27/15   Adonis Brook, NP  propranolol (INDERAL) 10 MG tablet Take 1 tablet (10 mg total) by mouth daily. 11/27/15   Adonis Brook, NP  zolpidem (AMBIEN) 5 MG tablet Take 1 tablet (5 mg total) by mouth at bedtime as needed for sleep. 11/27/15   Adonis Brook, NP    Family History No family history on file.  Social History Social History   Tobacco Use   Smoking status: Some Days    Types: Cigarettes   Smokeless tobacco: Never  Vaping Use   Vaping Use: Never used  Substance Use Topics   Alcohol use: Yes    Comment: occasional   Drug use: No     Allergies   Codeine and Percocet [oxycodone-acetaminophen]   Review of Systems Review of Systems Per HPI  Physical Exam Triage Vital Signs ED Triage Vitals  Enc Vitals Group     BP 03/17/21 1340 137/86     Pulse Rate 03/17/21 1340 77     Resp 03/17/21 1340 16     Temp 03/17/21 1340 99.2 F (37.3 C)     Temp Source 03/17/21 1340 Oral     SpO2 03/17/21 1340 97  %     Weight --      Height --      Head Circumference --      Peak Flow --      Pain Score 03/17/21 1332 9     Pain Loc --      Pain Edu? --      Excl. in GC? --    No data found.  Updated Vital Signs BP 137/86 (BP Location: Right Arm)    Pulse 77    Temp 99.2 F (37.3 C) (Oral)    Resp 16    SpO2 97%   Visual Acuity Right Eye Distance:   Left Eye Distance:   Bilateral Distance:    Right Eye Near:   Left Eye Near:    Bilateral Near:     Physical Exam Vitals and nursing note reviewed.  Constitutional:      Appearance: Normal appearance. She is not ill-appearing.  HENT:     Head: Atraumatic.  Eyes:     Extraocular Movements: Extraocular movements intact.     Conjunctiva/sclera: Conjunctivae normal.  Cardiovascular:     Rate and Rhythm: Normal rate and regular rhythm.     Heart sounds: Normal heart sounds.  Pulmonary:     Effort: Pulmonary effort is normal.     Breath sounds: Normal breath sounds.  Musculoskeletal:        General: Normal range of motion.     Cervical back: Normal range of motion and neck supple.  Skin:    General: Skin is warm.     Comments: 2 to 3 cm fluctuant axillary abscess on the left, spontaneously draining thick purulent fluid upon exam.  Pressure and gauze applied to further drain the area.  Neurological:     Mental Status: She is alert and oriented to person, place, and time.  Psychiatric:        Mood and Affect: Mood normal.        Thought Content: Thought content normal.        Judgment: Judgment normal.     UC Treatments / Results  Labs (all labs ordered are listed, but only abnormal results are displayed) Labs Reviewed - No data to display  EKG   Radiology No results found.  Procedures Procedures (including critical care time)  Medications Ordered in UC Medications - No data to display  Initial Impression / Assessment and Plan / UC Course  I have reviewed the triage vital signs and the nursing notes.  Pertinent  labs & imaging results that were available during my care of the patient were reviewed by me and considered in my medical decision making (see chart for details).  Abscess spontaneously draining on exam, further drained with pressure and gauze.  This was well-tolerated by patient.  We will treat with Bactrim, Hibiclens, warm compresses and follow-up in 1 week if not fully resolving.   Final Clinical Impressions(s) / UC Diagnoses   Final diagnoses:  Abscess of left axilla   Discharge Instructions   None    ED Prescriptions     Medication Sig Dispense Auth. Provider   sulfamethoxazole-trimethoprim (BACTRIM DS) 800-160 MG tablet Take 1 tablet by mouth 2 (two) times daily for 7 days. 14 tablet Particia Nearing, [place]   chlorhexidine (HIBICLENS) 4 % external liquid Apply topically daily as needed. 120 mL Particia Nearing, [place]      PDMP not reviewed this encounter.   Particia Nearing, [place] 03/17/21 1358

## 2021-03-17 NOTE — ED Triage Notes (Signed)
Patient states she has an abscess under her left armpit. She states she has had it since September.  Patient states it burns so bad that she can not take it.  She states that since then it has grown bigger and she put Biofreeze on it and that irritated it and it grew bigger with more pain.  Denies Meds

## 2021-03-18 ENCOUNTER — Encounter: Payer: Self-pay | Admitting: Neurology

## 2021-03-18 ENCOUNTER — Other Ambulatory Visit (HOSPITAL_COMMUNITY): Payer: Self-pay | Admitting: Orthopedic Surgery

## 2021-03-18 ENCOUNTER — Other Ambulatory Visit: Payer: Self-pay | Admitting: Internal Medicine

## 2021-03-18 ENCOUNTER — Other Ambulatory Visit: Payer: Self-pay | Admitting: Orthopedic Surgery

## 2021-03-18 DIAGNOSIS — M5416 Radiculopathy, lumbar region: Secondary | ICD-10-CM

## 2021-04-04 ENCOUNTER — Encounter: Payer: Self-pay | Admitting: Nurse Practitioner

## 2021-04-04 ENCOUNTER — Other Ambulatory Visit: Payer: Self-pay

## 2021-04-04 ENCOUNTER — Ambulatory Visit (INDEPENDENT_AMBULATORY_CARE_PROVIDER_SITE_OTHER): Payer: Medicare Other | Admitting: Nurse Practitioner

## 2021-04-04 VITALS — BP 130/71 | HR 87 | Ht 65.0 in | Wt 126.0 lb

## 2021-04-04 DIAGNOSIS — I10 Essential (primary) hypertension: Secondary | ICD-10-CM | POA: Insufficient documentation

## 2021-04-04 DIAGNOSIS — F323 Major depressive disorder, single episode, severe with psychotic features: Secondary | ICD-10-CM

## 2021-04-04 DIAGNOSIS — F3181 Bipolar II disorder: Secondary | ICD-10-CM

## 2021-04-04 DIAGNOSIS — G43909 Migraine, unspecified, not intractable, without status migrainosus: Secondary | ICD-10-CM

## 2021-04-04 DIAGNOSIS — R634 Abnormal weight loss: Secondary | ICD-10-CM | POA: Diagnosis not present

## 2021-04-04 DIAGNOSIS — D509 Iron deficiency anemia, unspecified: Secondary | ICD-10-CM

## 2021-04-04 DIAGNOSIS — E559 Vitamin D deficiency, unspecified: Secondary | ICD-10-CM

## 2021-04-04 DIAGNOSIS — K219 Gastro-esophageal reflux disease without esophagitis: Secondary | ICD-10-CM

## 2021-04-04 DIAGNOSIS — R350 Frequency of micturition: Secondary | ICD-10-CM

## 2021-04-04 DIAGNOSIS — K5900 Constipation, unspecified: Secondary | ICD-10-CM

## 2021-04-04 DIAGNOSIS — F209 Schizophrenia, unspecified: Secondary | ICD-10-CM

## 2021-04-04 DIAGNOSIS — I159 Secondary hypertension, unspecified: Secondary | ICD-10-CM

## 2021-04-04 DIAGNOSIS — M5430 Sciatica, unspecified side: Secondary | ICD-10-CM

## 2021-04-04 DIAGNOSIS — R9389 Abnormal findings on diagnostic imaging of other specified body structures: Secondary | ICD-10-CM

## 2021-04-04 DIAGNOSIS — E785 Hyperlipidemia, unspecified: Secondary | ICD-10-CM

## 2021-04-04 DIAGNOSIS — R718 Other abnormality of red blood cells: Secondary | ICD-10-CM

## 2021-04-04 LAB — POCT URINALYSIS DIP (CLINITEK)
Bilirubin, UA: NEGATIVE
Glucose, UA: NEGATIVE mg/dL
Ketones, POC UA: NEGATIVE mg/dL
Leukocytes, UA: NEGATIVE
Nitrite, UA: NEGATIVE
POC PROTEIN,UA: NEGATIVE
Spec Grav, UA: 1.015 (ref 1.010–1.025)
Urobilinogen, UA: 0.2 E.U./dL
pH, UA: 6.5 (ref 5.0–8.0)

## 2021-04-04 NOTE — Assessment & Plan Note (Signed)
Followed by otho.

## 2021-04-04 NOTE — Assessment & Plan Note (Signed)
She was seeing psych. . Takes lamotrigine 200mg  BID, Lorazepam 1 mg zoloft 100 mg, lumatepirone tosylate 42mg , benztropine cogentin 1mg .

## 2021-04-04 NOTE — Assessment & Plan Note (Signed)
Sumatriptan 100mg 

## 2021-04-04 NOTE — Assessment & Plan Note (Signed)
UA was negative for UTI. Drink plenty of water to stay hydrated.

## 2021-04-04 NOTE — Assessment & Plan Note (Signed)
Linzess 40mg  capsule daily

## 2021-04-04 NOTE — Assessment & Plan Note (Signed)
Was seeing pulmonologist last visit was on 12/26/20 asked to follow up after 6 months. Similar 8 mm right apical cyst with thick wall or dependent debris that is in continuity with a curvilinear, dilated and now debris filled peripheral airway. Similar thick-walled right upper lobe subsegmental bronchus, image 75. Less opacified, but thick-walled subsegmental bronchus, image 82. Similar dense posterior right upper lobe nodule, image 74. Similar subcentimeter right lower lobe cyst, image 109. Similar right middle lobe dilated airway with a radiopaque density, image 123 and similar partly debris filled dilated right middle lobe subsegmental bronchus, image 130. Pt  would like a referral to pulmonology here .

## 2021-04-04 NOTE — Progress Notes (Signed)
Danielle Sweeney     MRN: [id number]      DOB: 08-May-1970   HPI Danielle Sweeney is here to establish care.   Pt c/o dysuria, urinary frequency, denies fever chills, bloody urine  Pt c/o left side pain that radiates down leg and her toes. She is been followed by otho , hs history of sciatica. Just finished a dose of steroid  PCP was Jossie Ng, just relocated from [place] in October 2022.  Last annual exam was last year, colonoscopy was done in January 2022 had a polyp, no malignancy. Last mammogram was in April 2022. Last PAP unknown has IUD. she got one dose of shingles vaccine last year.   She had a Counsellor for 2 years   Depression Bipolar 2 disorder Schizophrenia disorder. She was seeing psych. . Takes lamotrigine 200mg  BID, Lorazepam 1 mg zoloft 100 mg, lumatepirone tosylate 42mg , benztropine cogentin 1mg .   Vitamin D deficiency. Takes choleciferol 400 units daily   Smoking tobacco Was on nicotine patch 14mg  , currently on nicotine patch 7mg . If she use patch consistently smokes 4 cigarettes a day  Abnormal Chest CT. Was seeing pulmonologist last visit was on 12/26/20 asked to follow up after 6 months. Similar 8 mm right apical cyst with thick wall or dependent debris that is in continuity with a curvilinear, dilated and now debris filled peripheral airway. Similar thick-walled right upper lobe subsegmental bronchus, image 75. Less opacified, but thick-walled subsegmental bronchus, image 82. Similar dense posterior right upper lobe nodule, image 74. Similar subcentimeter right lower lobe cyst, image 109. Similar right middle lobe dilated airway with a radiopaque density, image 123 and similar partly debris filled dilated right middle lobe subsegmental bronchus, image 130. Pt  would like a referral to pulmonology here .   GERD Takes linzess and montegrity, omeprazole 40mg  BID, Pepcid 40mg  daily  Unintentional weight loss, microcytosis, leucocytosis. She recently started seeing  hematology.   Sciatica  Degeneration of lumbar intervertebral disc -Degeneration of lumbar or lumbosacral intervertebral disc       . Has had epidural injection [facility] has upcomig appointmnt with them  Migraine HA Sumatriptan 100mg  PRN, propanolol 10 BID mg daily.      ROS Denies recent fever or chills. Denies sinus pressure, nasal congestion, ear pain or sore throat. Denies chest congestion, productive cough or wheezing. Denies chest pains, palpitations and leg swelling Denies abdominal pain, nausea, vomiting,diarrhea or constipation.   Has dysuria, frequency, no hesitancy incontinence. Has left side back pain radiating to left leg and toes. , no swelling and limitation in mobility. Denies headaches, seizures, numbness, or tingling. Denies depression, anxiety or insomnia. Denies skin break down or rash.   PE  BP 130/71 (BP Location: Right Arm, Patient Position: Sitting, Cuff Size: Normal)    Pulse 87    Ht 5\' 5"  (1.651 m)    Wt 126 lb (57.2 kg)    SpO2 95%    BMI 20.97 kg/m   Patient alert and oriented and in no cardiopulmonary distress.  HEENT: No facial asymmetry, EOMI,     Neck supple .  Chest: Clear to auscultation bilaterally.  CVS: S1, S2 no murmurs, no S3.Regular rate.  ABD: Soft non tender.   Ext: No edema  MS: Adequate ROM spine, shoulders, hips and knees. Has sciatica Numbness of toes  Skin: Intact, no ulcerations or rash noted.  Psych: Good eye contact, normal affect. Memory intact not anxious or depressed appearing.  intact, power,  normal throughout.no focal deficits noted.   Assessment & Plan

## 2021-04-04 NOTE — Assessment & Plan Note (Signed)
Takes  montegrity, omeprazole 40mg  BID, Pepcid 40mg  daily

## 2021-04-04 NOTE — Patient Instructions (Signed)

## 2021-04-04 NOTE — Assessment & Plan Note (Signed)
Need psych referral Lamotrigine 200mg  bid

## 2021-04-04 NOTE — Assessment & Plan Note (Signed)
akes choleciferol 400 units daily

## 2021-04-04 NOTE — Assessment & Plan Note (Signed)
She recently started seeing hematology.  CBC ordered.  Refer to hematology.

## 2021-04-04 NOTE — Assessment & Plan Note (Signed)
Previously taking atorvastatin 20mg  daily Will check lipid level

## 2021-04-04 NOTE — Assessment & Plan Note (Signed)
zoloft 100g daily Lorazepam 1mg  PRN

## 2021-04-04 NOTE — Assessment & Plan Note (Signed)
Refer to hematology 

## 2021-04-04 NOTE — Progress Notes (Deleted)
° °  Danielle Sweeney     MRN: [id number]      DOB: 1970-12-26   HPI Danielle Sweeney is here to establish care.   PCP was Danielle Sweeney, just relocated from [place] in October 2022.  Last annual exam was last year, colonoscopy was done in January 2022 had a polyp, no malignancy. Last mammogram was in April 2022. Last PAP unknown has IUD. He got one dose of shingles vaccine last year.   She has a Haematologist for 2 years   Unintentional weight loss Depression Bipolar 1 disorder Scizoaffective disorder Herniated disc. Has had epidural injection Leg leg pain Dr Yevette Edwards at [facility] has upcomig appointmnt with them   Was on nicotine patch 14mg  , currently on nicotine patch 7mg . If she wears patch consistently smokes 4      ROS Denies recent fever or chills. Denies sinus pressure, nasal congestion, ear pain or sore throat. Denies chest congestion, productive cough or wheezing. Denies chest pains, palpitations and leg swelling Denies abdominal pain, nausea, vomiting,diarrhea or constipation.   Denies dysuria, frequency, hesitancy or incontinence. Denies joint pain, swelling and limitation in mobility. Denies headaches, seizures, numbness, or tingling. Denies depression, anxiety or insomnia. Denies skin break down or rash.   PE  BP 130/71 (BP Location: Right Arm, Patient Position: Sitting, Cuff Size: Normal)    Pulse 87    Ht 5\' 5"  (1.651 m)    Wt 126 lb (57.2 kg)    SpO2 95%    BMI 20.97 kg/m   Patient alert and oriented and in no cardiopulmonary distress.  HEENT: No facial asymmetry, EOMI,     Neck supple .  Chest: Clear to auscultation bilaterally.  CVS: S1, S2 no murmurs, no S3.Regular rate.  ABD: Soft non tender.   Ext: No edema  MS: Adequate ROM spine, shoulders, hips and knees.  Skin: Intact, no ulcerations or rash noted.  Psych: Good eye contact, normal affect. Memory intact not anxious or depressed appearing.  CNS: CN 2-12 intact, power,  normal  throughout.no focal deficits noted.   Assessment & Plan

## 2021-04-04 NOTE — Assessment & Plan Note (Signed)
previously taking amlodipine 5mg  per review of pt's chart from her previous PCP. Will follow up on this at next visit.

## 2021-04-04 NOTE — Assessment & Plan Note (Addendum)
Need psych referral, was seeing psych at her previous location.  lumatepirone tosykate 42mg 

## 2021-04-07 NOTE — Progress Notes (Signed)
UA ws negative for uti. Thanks  Drink plenty of  water to stay hydrated.

## 2021-04-08 ENCOUNTER — Other Ambulatory Visit: Payer: Self-pay | Admitting: Nurse Practitioner

## 2021-04-08 ENCOUNTER — Other Ambulatory Visit: Payer: Self-pay | Admitting: *Deleted

## 2021-04-08 ENCOUNTER — Encounter: Payer: Self-pay | Admitting: Nurse Practitioner

## 2021-04-08 DIAGNOSIS — F323 Major depressive disorder, single episode, severe with psychotic features: Secondary | ICD-10-CM

## 2021-04-08 DIAGNOSIS — G43909 Migraine, unspecified, not intractable, without status migrainosus: Secondary | ICD-10-CM

## 2021-04-08 MED ORDER — OMEPRAZOLE 40 MG PO CPDR: 40.0000 mg | DELAYED_RELEASE_CAPSULE | Freq: Every day | ORAL | 0 refills | Status: DC

## 2021-04-08 MED ORDER — LUMATEPERONE TOSYLATE 42 MG PO CAPS: 42.0000 mg | ORAL_CAPSULE | Freq: Every day | ORAL | 0 refills | Status: AC

## 2021-04-08 MED ORDER — LORAZEPAM 1 MG PO TABS: 1.0000 mg | ORAL_TABLET | Freq: Every day | ORAL | 0 refills | Status: DC | PRN

## 2021-04-08 MED ORDER — FAMOTIDINE 40 MG PO TABS: 40.0000 mg | ORAL_TABLET | Freq: Every day | ORAL | 0 refills | Status: DC

## 2021-04-08 MED ORDER — SERTRALINE HCL 100 MG PO TABS: 100.0000 mg | ORAL_TABLET | Freq: Every day | ORAL | 0 refills | Status: DC

## 2021-04-08 MED ORDER — TRAZODONE HCL 300 MG PO TABS
300.0000 mg | ORAL_TABLET | Freq: Every day | ORAL | 0 refills | Status: DC
Start: 2021-04-08 — End: 2022-06-09

## 2021-04-08 MED ORDER — LINACLOTIDE 290 MCG PO CAPS: 290.0000 ug | ORAL_CAPSULE | Freq: Every day | ORAL | 0 refills | Status: DC

## 2021-04-08 MED ORDER — SUMATRIPTAN SUCCINATE 100 MG PO TABS: 100.0000 mg | ORAL_TABLET | Freq: Every day | ORAL | 0 refills | Status: DC | PRN

## 2021-04-08 MED ORDER — ONDANSETRON HCL 4 MG PO TABS: 4.0000 mg | ORAL_TABLET | Freq: Three times a day (TID) | ORAL | 0 refills | Status: AC | PRN

## 2021-04-08 MED ORDER — FAMOTIDINE 40 MG PO TABS: 40.0000 mg | ORAL_TABLET | Freq: Every day | ORAL | 3 refills | Status: DC

## 2021-04-08 MED ORDER — LAMOTRIGINE 100 MG PO TABS: ORAL_TABLET | ORAL | 0 refills | Status: DC

## 2021-04-09 ENCOUNTER — Telehealth: Payer: Self-pay | Admitting: Nurse Practitioner

## 2021-04-09 NOTE — Telephone Encounter (Signed)
LM for patient to return call.

## 2021-04-09 NOTE — Telephone Encounter (Signed)
Called and notified patient per Mitzi Davenport they will discuss starting Chantix at her next follow up. Patient verbalized understanding.

## 2021-04-09 NOTE — Telephone Encounter (Signed)
Pt came by office in regard to Chantex   Pt states that if possible she would like to try that instead of Nicotine patch

## 2021-04-11 ENCOUNTER — Other Ambulatory Visit: Payer: Self-pay | Admitting: *Deleted

## 2021-04-11 ENCOUNTER — Encounter: Payer: Self-pay | Admitting: Nurse Practitioner

## 2021-04-11 ENCOUNTER — Other Ambulatory Visit: Payer: Self-pay | Admitting: Nurse Practitioner

## 2021-04-12 ENCOUNTER — Other Ambulatory Visit: Payer: Self-pay | Admitting: Nurse Practitioner

## 2021-04-12 DIAGNOSIS — Z720 Tobacco use: Secondary | ICD-10-CM

## 2021-04-12 MED ORDER — NICOTINE 7 MG/24HR TD PT24: 7.0000 mg | MEDICATED_PATCH | Freq: Every day | TRANSDERMAL | 0 refills | Status: DC

## 2021-04-14 ENCOUNTER — Ambulatory Visit: Payer: Medicaid Other | Admitting: Nurse Practitioner

## 2021-04-18 ENCOUNTER — Other Ambulatory Visit: Payer: Self-pay

## 2021-04-18 DIAGNOSIS — R202 Paresthesia of skin: Secondary | ICD-10-CM

## 2021-04-19 ENCOUNTER — Ambulatory Visit (HOSPITAL_COMMUNITY)
Admission: RE | Admit: 2021-04-19 | Discharge: 2021-04-19 | Disposition: A | Discharge status: Home / Self Care | Payer: Medicare Other | Source: Ambulatory Visit | Attending: Orthopedic Surgery | Admitting: Orthopedic Surgery

## 2021-04-19 DIAGNOSIS — M5416 Radiculopathy, lumbar region: Secondary | ICD-10-CM | POA: Insufficient documentation

## 2021-04-21 NOTE — Progress Notes (Signed)
Danielle Sweeney [address] [place] [address]   CLINIC:  Medical Oncology/Hematology  CONSULT NOTE  Patient Care Team: Donell Beers, FNP as PCP - General (Nurse Practitioner)  CHIEF COMPLAINTS/PURPOSE OF CONSULTATION:  Microcytic anemia  HISTORY OF PRESENTING ILLNESS:  Danielle Sweeney 51 y.o. female is here at the request of her primary care provider (NP Edwin Dada) for evaluation of microcytic anemia.  Per records, patient recently moved to [place] from [place].  She had just started hematology evaluation in [place] before moving to [place].  Per records available through care everywhere, she last saw Dr. Barnett Abu [facility]) in October 2022.  Her labs (11/29/2020) showed hemoglobin 10.8/MCV 75.7, with borderline low iron saturation 15%, and normal ferritin 73.  HIV and hepatitis panels were negative.  Peripheral smear showed microcytic anemia and occasional target cell with differential including liver disease and hemoglobinopathy.  Patient is noted to have a family history of sickle cell anemia.  She had a screening test for sickle cell performed on 05/10/2020, which was negative; additional testing was not pursued but patient expressed interest in more definitive diagnosis, despite being told that diagnosis of sickle cell trait would likely not change management.  She was also thought to have reactive leukocytosis (WBC 11.1/ANC 8.2) in the setting of tobacco use.  Patient reports that she has been "anemic all her life."  She reports that her father had sickle cell disease, and that several of her aunts and uncles passed away from sickle cell disease as well.  She denies any signs of major blood loss such as epistaxis, hematuria, hematochezia, or melena.  She denies any frank hematuria, but reports that she has been found to have microscopic hematuria on multiple urinalyses, and that her PCP is monitoring her for  this.  She denies recent chest pain on exertion, but does have occasional sharp substernal pain which she thinks may be heartburn.  She has occasional presyncopal episodes.  She denies any shortness of breath or palpitations.  She denies any pica or restless legs.  She is on omeprazole for severe reflux esophagitis.  She reports that she has had ongoing GI issues since October 2021 with frequent nausea, reflux, poor appetite, and occasional diarrhea.  Due to her GI symptoms and lack of appetite, she reports that she has lost about 60 pounds in the past 15 months.  She reports that she had EGD and colonoscopy in January 2022, positive for colon polyp x1, no bleeding or ulcers per her report.  She does not take any iron supplement at home, but takes daily multivitamin, vitamin B12, and vitamin D.  She had no prior history or diagnosis of cancer. Her age appropriate screening programs are up-to-date.  She never donated blood or received blood transfusion  She has mild intermittent leukocytosis.  She is a current everyday tobacco user, smoking 5 to 10 cigars per day. She also has frequent abscesses in her axilla and groin, usually about once per month; most recently required antibiotics in December 2022.  She also uses Symbicort inhaled steroids.  Apart from her poor appetite, GI issues, and subsequent weight loss she denies any B symptoms such as fever, chills, night sweats.  She has not noticed any new lumps or bumps.  She reports 25% energy with 50% appetite.   MEDICAL HISTORY:  Past Medical History:  Diagnosis Date   Bipolar 1 disorder (HCC)    Depression    Suicide attempt (HCC)  SURGICAL HISTORY: Past Surgical History:  Procedure Laterality Date   BACK SURGERY Bilateral 2015   BUNIONECTOMY     LIPOMA EXCISION      SOCIAL HISTORY: Social History   Socioeconomic History   Marital status: Legally Separated    Spouse name: Not on file   Number of children: Not on file   Years of  education: Not on file   Highest education level: Not on file  Occupational History   Not on file  Tobacco Use   Smoking status: Some Days    Types: Cigarettes   Smokeless tobacco: Never   Tobacco comments:    Started smoking cigarettes 5 years ago. Smokes 12 sticks a day. Currently smoking 4 cigarettes daily.  Vaping Use   Vaping Use: Never used  Substance and Sexual Activity   Alcohol use: Yes    Comment: occasional   Drug use: No   Sexual activity: Not Currently    Birth control/protection: I.U.D.  Other Topics Concern   Not on file  Social History Narrative   Lives with her mother, on disability for mental health and chronic back pain   Social Determinants of Health   Financial Resource Strain: Not on file  Food Insecurity: Not on file  Transportation Needs: Not on file  Physical Activity: Not on file  Stress: Not on file  Social Connections: Not on file  Intimate Partner Violence: Not on file    FAMILY HISTORY: Family History  Problem Relation Age of Onset   Heart failure Mother    Sickle cell anemia Father    Asthma Brother    Heart attack Maternal Grandmother    Diabetes type II Maternal Grandmother    Lung cancer Maternal Grandfather    Breast cancer Neg Hx    Colon cancer Neg Hx     ALLERGIES:  is allergic to codeine and percocet [oxycodone-acetaminophen].  MEDICATIONS:  Current Outpatient Medications  Medication Sig Dispense Refill   benztropine (COGENTIN) 1 MG tablet Take 1 tablet (1 mg total) by mouth 2 (two) times daily. 60 tablet 0   budesonide-formoterol (SYMBICORT) 160-4.5 MCG/ACT inhaler Inhale into the lungs.     Cholecalciferol 10 MCG (400 UNIT) CAPS Take 1 capsule by mouth daily.     cyanocobalamin 1000 MCG tablet Take 1,000 mcg by mouth daily.     famotidine (PEPCID) 40 MG tablet TAKE 1 TABLET(40 MG) BY MOUTH DAILY 90 tablet 0   famotidine (PEPCID) 40 MG tablet Take 1 tablet (40 mg total) by mouth daily. 30 tablet 3   lamoTRIgine  (LAMICTAL) 100 MG tablet TAKE 2 TABLETS BY ORAL ROUTE IN THE MORNING AND 2 TABLETS AT BEDTIME 120 tablet 0   linaclotide (LINZESS) 290 MCG CAPS capsule Take 1 capsule (290 mcg total) by mouth daily before breakfast. 30 capsule 0   LORazepam (ATIVAN) 1 MG tablet Take 1 tablet (1 mg total) by mouth daily as needed for anxiety. 30 tablet 0   lumateperone tosylate (CAPLYTA) 42 MG capsule Take 1 capsule (42 mg total) by mouth daily. 30 capsule 0   methylPREDNISolone (MEDROL DOSEPAK) 4 MG TBPK tablet See admin instructions. follow package directions     Multiple Vitamin (MULTIVITAMIN) tablet Take 1 tablet by mouth daily.     nicotine (NICODERM CQ - DOSED IN MG/24 HR) 7 mg/24hr patch Place 1 patch (7 mg total) onto the skin daily. 28 patch 0   ondansetron (ZOFRAN) 4 MG tablet Take 1 tablet (4 mg total) by mouth every 8 (  eight) hours as needed for nausea or vomiting. 20 tablet 0   propranolol (INDERAL) 10 MG tablet Take 1 tablet (10 mg total) by mouth daily. 30 tablet 0   Prucalopride Succinate 2 MG TABS Take 1 tablet by mouth daily.     sertraline (ZOLOFT) 100 MG tablet Take 1 tablet (100 mg total) by mouth daily. 30 tablet 0   SUMAtriptan (IMITREX) 100 MG tablet Take 1 tablet (100 mg total) by mouth daily as needed for migraine. 30 tablet 0   trazodone (DESYREL) 300 MG tablet Take 1 tablet (300 mg total) by mouth at bedtime. 30 tablet 0   No current facility-administered medications for this visit.    REVIEW OF SYSTEMS:   Review of Systems  Constitutional:  Positive for appetite change, fatigue and unexpected weight change. Negative for chills, diaphoresis and fever.  HENT:   Negative for lump/mass and nosebleeds.   Eyes:  Negative for eye problems.  Respiratory:  Positive for cough. Negative for hemoptysis and shortness of breath.   Cardiovascular:  Positive for chest pain and palpitations. Negative for leg swelling.  Gastrointestinal:  Positive for constipation, diarrhea and nausea. Negative for  abdominal pain, blood in stool and vomiting.  Genitourinary:  Positive for dysuria and hematuria (microscopic).   Musculoskeletal:  Positive for arthralgias and back pain.  Skin: Negative.   Neurological:  Positive for dizziness, headaches and numbness. Negative for light-headedness.  Hematological:  Does not bruise/bleed easily.  Psychiatric/Behavioral:  Positive for depression. The patient is nervous/anxious.      PHYSICAL EXAMINATION: ECOG PERFORMANCE STATUS: 1 - Symptomatic but completely ambulatory  There were no vitals filed for this visit. There were no vitals filed for this visit.  Physical Exam Constitutional:      Appearance: Normal appearance.  HENT:     Head: Normocephalic and atraumatic.     Mouth/Throat:     Mouth: Mucous membranes are moist.  Eyes:     Extraocular Movements: Extraocular movements intact.     Pupils: Pupils are equal, round, and reactive to light.  Cardiovascular:     Rate and Rhythm: Normal rate and regular rhythm.     Pulses: Normal pulses.     Heart sounds: Normal heart sounds.  Pulmonary:     Effort: Pulmonary effort is normal.     Breath sounds: Normal breath sounds.  Abdominal:     General: Bowel sounds are normal.     Palpations: Abdomen is soft.     Tenderness: There is no abdominal tenderness.  Musculoskeletal:        General: No swelling.     Right lower leg: No edema.     Left lower leg: No edema.  Lymphadenopathy:     Cervical: No cervical adenopathy.  Skin:    General: Skin is warm and dry.  Neurological:     General: No focal deficit present.     Mental Status: She is alert and oriented to person, place, and time.  Psychiatric:        Mood and Affect: Mood normal.        Behavior: Behavior normal.      LABORATORY DATA:  I have reviewed the data as listed Recent Results (from the past 2160 hour(s))  POCT URINALYSIS DIP (CLINITEK)     Status: Abnormal   Collection Time: 04/04/21  2:17 PM  Result Value Ref Range    Color, UA yellow yellow   Clarity, UA clear clear   Glucose, UA negative negative mg/dL  Bilirubin, UA negative negative   Ketones, POC UA negative negative mg/dL   Spec Grav, UA 1.015 1.010 - 1.025   Blood, UA small (A) negative   pH, UA 6.5 5.0 - 8.0   POC PROTEIN,UA negative negative, trace   Urobilinogen, UA 0.2 0.2 or 1.0 E.U./dL   Nitrite, UA Negative Negative   Leukocytes, UA Negative Negative    RADIOGRAPHIC STUDIES: I have personally reviewed the radiological images as listed and agreed with the findings in the report. MR LUMBAR SPINE WO CONTRAST  Result Date: 04/21/2021 CLINICAL DATA:  Left-sided low back pain and leg pain EXAM: MRI LUMBAR SPINE WITHOUT CONTRAST TECHNIQUE: Multiplanar, multisequence MR imaging of the lumbar spine was performed. No intravenous contrast was administered. COMPARISON:  None. FINDINGS: Segmentation:  Standard. Alignment:  Physiologic. Vertebrae: No acute fracture, evidence of discitis, or aggressive bone lesion. Conus medullaris and cauda equina: Conus extends to the T12 level. Conus and cauda equina appear normal. Paraspinal and other soft tissues: No acute paraspinal abnormality. Disc levels: Disc spaces: Degenerative disease with disc height loss at L4-5 and L5-S1. T12-L1: No significant disc bulge. No neural foraminal stenosis. No central canal stenosis. L1-L2: No significant disc bulge. No neural foraminal stenosis. No central canal stenosis. Mild bilateral facet arthropathy. L2-L3: No significant disc bulge. No neural foraminal stenosis. No central canal stenosis. L3-L4: No significant disc bulge. No neural foraminal stenosis. No central canal stenosis. Mild bilateral facet arthropathy. L4-L5: Broad-based disc bulge. Mild bilateral facet arthropathy. Bilateral subarticular recess stenosis. No foraminal or central canal stenosis. L5-S1: Broad-based disc bulge. Left laminectomy. Mild epidural fibrosis along the left lateral aspect of the thecal sac. No  left foraminal stenosis. Mild right foraminal stenosis. No central canal stenosis. Moderate bilateral facet arthropathy. IMPRESSION: 1. At L5-S1 there is a broad-based disc bulge. Left laminectomy. Mild epidural fibrosis along the left lateral aspect of the thecal sac. No left foraminal stenosis. Mild right foraminal stenosis. Moderate bilateral facet arthropathy. 2. At L4-5 there is a broad-based disc bulge. Mild bilateral facet arthropathy. Bilateral subarticular recess stenosis. 3.  No acute osseous injury of the lumbar spine. Electronically Signed   By: Kathreen Devoid M.D.   On: 04/21/2021 14:47    ASSESSMENT & PLAN: 1.  Microcytic anemia - Patient recently relocated from [facility], had started hematology work-up of microcytic anemia with Dr. Sherrian Divers in [place] (records via [place] personally reviewed by me) - Most recent CBC (11/29/2020): Hgb 10.8/MCV 75.7 - Most recent iron panel (12/27/2020): Iron saturation 15%, ferritin 73 - Pathology smear review (12/27/2020): Mild microcytic anemia with occasional target cells, consider liver disease, hemoglobinopathy, or other etiology. - She reports family history of sickle cell disease in her father and several paternal aunts and uncles - No bright red blood per rectum or melena - She does not take any iron at home - PLAN: Repeat CBC, iron panel, with addition of hemoglobinopathy cascade. - Check SPEP and nutritional panel with B12/methylmalonic acid, folate, copper - RTC in 3 weeks to discuss results and next steps  2.  Leukocytosis - Most recent CBC (11/29/2020): WBC 11.1/ANC 8.2. - Pathology smear review (12/27/2020): Mild leukocytosis with proportional increase of mature polys and polymorphous lymphocytes, appears reactive - Patient is a current every day smoker  - She also takes regular inhaled steroids  - She has intermittent axillary abscesses, most recently required antibiotics in December 2022 - She denies any B symptoms  or new lumps or bumps  - PLAN: Differential diagnosis  favors reactive leukocytosis. - We will repeat CBC and LDH today. - If no significant deviations from baseline, we will recommend watchful waiting.  Would consider MPN work-up if any significant changes.  3.  Other history - SOCIAL: Smokes 5 to 10 cigars daily.  Drinks wine about once per week.  Denies any history of illicit drug use. - FAMILY: Multiple family members on her father side with sickle cell disease.  Maternal grandfather   PLAN SUMMARY & DISPOSITION: Labs today RTC in 3 weeks  All questions were answered. The patient knows to call the clinic with any problems, questions or concerns.   Medical decision making: Moderate  Time spent on visit: I spent 25 minutes counseling the patient face to face. The total time spent in the appointment was 40 minutes and more than 50% was on counseling.  I, Tarri Abernethy PA-C, have seen this patient in conjunction with Dr. Derek Jack.  Greater than 50% of visit was performed by Dr. Delton Coombes.     Harriett Rush, PA-C 04/22/2021 12:04 PM  DR. Leevon Upperman: I have independently evaluated this patient and formulated my assessment and plan.  I agree with HPI, assessment and plan written by Casey Burkitt, PA-C.  She is evaluated for microcytosis and mild anemia.  She reports that her father had sickle cell disease.  Her daughter also has some trait.  She does not have any clinical signs or symptoms of sickle cell disease.  We will check her hemoglobin electrophoresis and iron indices.  She also reported history of hematuria, which can go along with sickle cell trait.  She will return to clinic in 3 weeks to discuss results.

## 2021-04-22 ENCOUNTER — Encounter (HOSPITAL_COMMUNITY): Payer: Self-pay

## 2021-04-22 ENCOUNTER — Inpatient Hospital Stay (HOSPITAL_COMMUNITY): Payer: Medicare Other | Attending: Hematology | Admitting: Hematology

## 2021-04-22 ENCOUNTER — Inpatient Hospital Stay (HOSPITAL_COMMUNITY): Payer: Medicare Other

## 2021-04-22 ENCOUNTER — Ambulatory Visit: Payer: Medicare Other | Admitting: Neurology

## 2021-04-22 ENCOUNTER — Encounter (HOSPITAL_COMMUNITY): Payer: Self-pay | Admitting: Hematology

## 2021-04-22 ENCOUNTER — Other Ambulatory Visit: Payer: Self-pay

## 2021-04-22 ENCOUNTER — Ambulatory Visit (HOSPITAL_COMMUNITY): Payer: Medicare Other

## 2021-04-22 VITALS — BP 126/85 | HR 92 | Temp 97.8°F | Resp 16 | Ht 65.35 in | Wt 125.2 lb

## 2021-04-22 DIAGNOSIS — F319 Bipolar disorder, unspecified: Secondary | ICD-10-CM

## 2021-04-22 DIAGNOSIS — Z8249 Family history of ischemic heart disease and other diseases of the circulatory system: Secondary | ICD-10-CM | POA: Insufficient documentation

## 2021-04-22 DIAGNOSIS — M545 Low back pain, unspecified: Secondary | ICD-10-CM

## 2021-04-22 DIAGNOSIS — Z803 Family history of malignant neoplasm of breast: Secondary | ICD-10-CM | POA: Diagnosis not present

## 2021-04-22 DIAGNOSIS — R630 Anorexia: Secondary | ICD-10-CM | POA: Diagnosis not present

## 2021-04-22 DIAGNOSIS — Z9151 Personal history of suicidal behavior: Secondary | ICD-10-CM | POA: Diagnosis not present

## 2021-04-22 DIAGNOSIS — R197 Diarrhea, unspecified: Secondary | ICD-10-CM | POA: Diagnosis not present

## 2021-04-22 DIAGNOSIS — Z832 Family history of diseases of the blood and blood-forming organs and certain disorders involving the immune mechanism: Secondary | ICD-10-CM

## 2021-04-22 DIAGNOSIS — M79606 Pain in leg, unspecified: Secondary | ICD-10-CM | POA: Diagnosis not present

## 2021-04-22 DIAGNOSIS — Z801 Family history of malignant neoplasm of trachea, bronchus and lung: Secondary | ICD-10-CM

## 2021-04-22 DIAGNOSIS — Z7952 Long term (current) use of systemic steroids: Secondary | ICD-10-CM

## 2021-04-22 DIAGNOSIS — F17210 Nicotine dependence, cigarettes, uncomplicated: Secondary | ICD-10-CM

## 2021-04-22 DIAGNOSIS — D509 Iron deficiency anemia, unspecified: Secondary | ICD-10-CM | POA: Diagnosis present

## 2021-04-22 DIAGNOSIS — Z7951 Long term (current) use of inhaled steroids: Secondary | ICD-10-CM

## 2021-04-22 DIAGNOSIS — Z833 Family history of diabetes mellitus: Secondary | ICD-10-CM | POA: Diagnosis not present

## 2021-04-22 DIAGNOSIS — D573 Sickle-cell trait: Secondary | ICD-10-CM

## 2021-04-22 DIAGNOSIS — Z885 Allergy status to narcotic agent status: Secondary | ICD-10-CM | POA: Insufficient documentation

## 2021-04-22 DIAGNOSIS — G96198 Other disorders of meninges, not elsewhere classified: Secondary | ICD-10-CM | POA: Diagnosis not present

## 2021-04-22 DIAGNOSIS — K2100 Gastro-esophageal reflux disease with esophagitis, without bleeding: Secondary | ICD-10-CM

## 2021-04-22 DIAGNOSIS — Z79899 Other long term (current) drug therapy: Secondary | ICD-10-CM | POA: Diagnosis not present

## 2021-04-22 DIAGNOSIS — D72829 Elevated white blood cell count, unspecified: Secondary | ICD-10-CM | POA: Diagnosis not present

## 2021-04-22 DIAGNOSIS — R110 Nausea: Secondary | ICD-10-CM

## 2021-04-22 DIAGNOSIS — F17290 Nicotine dependence, other tobacco product, uncomplicated: Secondary | ICD-10-CM | POA: Insufficient documentation

## 2021-04-22 DIAGNOSIS — M47819 Spondylosis without myelopathy or radiculopathy, site unspecified: Secondary | ICD-10-CM

## 2021-04-22 LAB — IRON AND TIBC
Iron: 148 ug/dL (ref 28–170)
Saturation Ratios: 33 % — ABNORMAL HIGH (ref 10.4–31.8)
TIBC: 450 ug/dL (ref 250–450)
UIBC: 302 ug/dL

## 2021-04-22 LAB — CBC WITH DIFFERENTIAL/PLATELET
Abs Immature Granulocytes: 0.02 10*3/uL (ref 0.00–0.07)
Basophils Absolute: 0 10*3/uL (ref 0.0–0.1)
Basophils Relative: 0 %
Eosinophils Absolute: 0 10*3/uL (ref 0.0–0.5)
Eosinophils Relative: 1 %
HCT: 39.5 % (ref 36.0–46.0)
Hemoglobin: 12.8 g/dL (ref 12.0–15.0)
Immature Granulocytes: 0 %
Lymphocytes Relative: 22 %
Lymphs Abs: 1.8 10*3/uL (ref 0.7–4.0)
MCH: 24.6 pg — ABNORMAL LOW (ref 26.0–34.0)
MCHC: 32.4 g/dL (ref 30.0–36.0)
MCV: 76 fL — ABNORMAL LOW (ref 80.0–100.0)
Monocytes Absolute: 0.4 10*3/uL (ref 0.1–1.0)
Monocytes Relative: 6 %
Neutro Abs: 5.7 10*3/uL (ref 1.7–7.7)
Neutrophils Relative %: 71 %
Platelets: 207 10*3/uL (ref 150–400)
RBC: 5.2 MIL/uL — ABNORMAL HIGH (ref 3.87–5.11)
RDW: 16.4 % — ABNORMAL HIGH (ref 11.5–15.5)
WBC: 8 10*3/uL (ref 4.0–10.5)
nRBC: 0 % (ref 0.0–0.2)

## 2021-04-22 LAB — COMPREHENSIVE METABOLIC PANEL
ALT: 42 U/L (ref 0–44)
AST: 31 U/L (ref 15–41)
Albumin: 4.6 g/dL (ref 3.5–5.0)
Alkaline Phosphatase: 45 U/L (ref 38–126)
Anion gap: 9 (ref 5–15)
BUN: 11 mg/dL (ref 6–20)
CO2: 25 mmol/L (ref 22–32)
Calcium: 10.2 mg/dL (ref 8.9–10.3)
Chloride: 105 mmol/L (ref 98–111)
Creatinine, Ser: 0.7 mg/dL (ref 0.44–1.00)
GFR, Estimated: >60 mL/min (ref >60)
Glucose, Bld: 100 mg/dL — ABNORMAL HIGH (ref 70–99)
Potassium: 3.7 mmol/L (ref 3.5–5.1)
Sodium: 139 mmol/L (ref 135–145)
Total Bilirubin: 0.4 mg/dL (ref 0.3–1.2)
Total Protein: 8 g/dL (ref 6.5–8.1)

## 2021-04-22 LAB — LACTATE DEHYDROGENASE: LDH: 149 U/L (ref 98–192)

## 2021-04-22 LAB — RETICULOCYTES
Immature Retic Fract: 11.5 % (ref 2.3–15.9)
RBC.: 5.16 MIL/uL — ABNORMAL HIGH (ref 3.87–5.11)
Retic Count, Absolute: 45.4 10*3/uL (ref 19.0–186.0)
Retic Ct Pct: 0.9 % (ref 0.4–3.1)

## 2021-04-22 LAB — FOLATE: Folate: 21.9 ng/mL (ref >5.9)

## 2021-04-22 LAB — VITAMIN B12: Vitamin B-12: 1307 pg/mL — ABNORMAL HIGH (ref 180–914)

## 2021-04-22 LAB — FERRITIN: Ferritin: 49 ng/mL (ref 11–307)

## 2021-04-22 NOTE — Patient Instructions (Signed)
[place] at [facility] Discharge Instructions  You were seen today by Tarri Abernethy PA-C for your anemia.    LABS: Check labs today to investigate the cause of your anemia.  MEDICATIONS: No changes to home medications  FOLLOW-UP APPOINTMENT: Office visit in 3 weeks to discuss results   Thank you for choosing [place] at [facility] to provide your oncology and hematology care.  To afford each patient quality time with our provider, please arrive at least 15 minutes before your scheduled appointment time.   If you have a lab appointment with the [facility] please come in thru the Main Entrance and check in at the main information desk.  You need to re-schedule your appointment should you arrive 10 or more minutes late.  We strive to give you quality time with our providers, and arriving late affects you and other patients whose appointments are after yours.  Also, if you no show three or more times for appointments you may be dismissed from the clinic at the providers discretion.     Again, thank you for choosing [facility].  Our hope is that these requests will decrease the amount of time that you wait before being seen by our physicians.       _____________________________________________________________  Should you have questions after your visit to [facility], please contact our office at [phone] and follow the prompts.  Our office hours are 8:00 a.m. and 4:30 p.m. Monday - Friday.  Please note that voicemails left after 4:00 p.m. may not be returned until the following business day.  We are closed weekends and major holidays.  You do have access to a nurse 24-7, just call the main number to the clinic [phone] and do not press any options, hold on the line and a nurse will answer the phone.    For prescription refill requests, have your pharmacy contact our office and allow 72 hours.    Due  to Covid, you will need to wear a mask upon entering the hospital. If you do not have a mask, a mask will be given to you at the Main Entrance upon arrival. For doctor visits, patients may have 1 support person age 82 or older with them. For treatment visits, patients can not have anyone with them due to social distancing guidelines and our immunocompromised population.

## 2021-04-23 ENCOUNTER — Other Ambulatory Visit: Payer: Self-pay | Admitting: Nurse Practitioner

## 2021-04-23 ENCOUNTER — Telehealth: Payer: Self-pay

## 2021-04-23 ENCOUNTER — Encounter (HOSPITAL_COMMUNITY): Payer: Self-pay

## 2021-04-23 ENCOUNTER — Telehealth: Payer: Self-pay | Admitting: Nurse Practitioner

## 2021-04-23 DIAGNOSIS — G43909 Migraine, unspecified, not intractable, without status migrainosus: Secondary | ICD-10-CM

## 2021-04-23 LAB — KAPPA/LAMBDA LIGHT CHAINS
Kappa free light chain: 22.6 mg/L — ABNORMAL HIGH (ref 3.3–19.4)
Kappa, lambda light chain ratio: 1.69 — ABNORMAL HIGH (ref 0.26–1.65)
Lambda free light chains: 13.4 mg/L (ref 5.7–26.3)

## 2021-04-23 NOTE — Telephone Encounter (Signed)
Please advise 

## 2021-04-23 NOTE — Telephone Encounter (Signed)
Advised pt that we have no control over which pharmaceutical company it is filled by pharmacy. She said the one she wanted [facility] had one through sun let her know that we [facility] and to call them to see if they could fill this she stated she never had problem at [facility]. Let her know [facility] she will send back a mychart and let me know what we could do to help

## 2021-04-23 NOTE — Telephone Encounter (Signed)
LVM for pt to call the office to figure out what is going on as it looks as if she would like this sent to a different pharmacy

## 2021-04-23 NOTE — Telephone Encounter (Signed)
Patient called about medicine Sumatriptan 100 mg  Has 2 different manufacturers  [facility] is not effective at [facility].  Can the provider send in another refill with a different manufacturer [employer]. Please contact patient with any questions. [phone].  Please send to [facility] on [address] in [place].

## 2021-04-23 NOTE — Telephone Encounter (Signed)
Spoke with pt

## 2021-04-23 NOTE — Telephone Encounter (Signed)
Pt returning call

## 2021-04-24 ENCOUNTER — Telehealth: Payer: Self-pay

## 2021-04-24 ENCOUNTER — Other Ambulatory Visit: Payer: Self-pay

## 2021-04-24 DIAGNOSIS — G43909 Migraine, unspecified, not intractable, without status migrainosus: Secondary | ICD-10-CM

## 2021-04-24 LAB — PROTEIN ELECTROPHORESIS, SERUM
A/G Ratio: 1.2 (ref 0.7–1.7)
Albumin ELP: 4.2 g/dL (ref 2.9–4.4)
Alpha-1-Globulin: 0.3 g/dL (ref 0.0–0.4)
Alpha-2-Globulin: 0.9 g/dL (ref 0.4–1.0)
Beta Globulin: 1.1 g/dL (ref 0.7–1.3)
Gamma Globulin: 1.1 g/dL (ref 0.4–1.8)
Globulin, Total: 3.4 g/dL (ref 2.2–3.9)
Total Protein ELP: 7.6 g/dL (ref 6.0–8.5)

## 2021-04-24 LAB — IMMUNOFIXATION ELECTROPHORESIS
IgA: 254 mg/dL (ref 87–352)
IgG (Immunoglobin G), Serum: 962 mg/dL (ref 586–1602)
IgM (Immunoglobulin M), Srm: 51 mg/dL (ref 26–217)
Total Protein ELP: 7.3 g/dL (ref 6.0–8.5)

## 2021-04-24 LAB — HGB FRACTIONATION CASCADE
Hgb A2: 2 % (ref 1.8–3.2)
Hgb A: 98 % (ref 96.4–98.8)
Hgb F: 0 % (ref 0.0–2.0)
Hgb S: 0 %

## 2021-04-24 LAB — COPPER, SERUM: Copper: 138 ug/dL (ref 80–158)

## 2021-04-24 MED ORDER — SUMATRIPTAN SUCCINATE 100 MG PO TABS: ORAL_TABLET | ORAL | 0 refills | Status: DC

## 2021-04-24 NOTE — Telephone Encounter (Signed)
Called [facility] cancelled rx, called pt advised [facility] pt verbalized understanding

## 2021-04-24 NOTE — Telephone Encounter (Signed)
Patient called to give nurse an message  The prescription Sumatriptan 100 mg, says this is the wrong one that was [facility] [place] [place]   [facility]  [address] [place] [address] Store # [phone]

## 2021-04-27 LAB — METHYLMALONIC ACID, SERUM: Methylmalonic Acid, Quantitative: 109 nmol/L (ref 0–378)

## 2021-05-12 ENCOUNTER — Encounter: Payer: Self-pay | Admitting: Nurse Practitioner

## 2021-05-12 ENCOUNTER — Other Ambulatory Visit: Payer: Self-pay | Admitting: Nurse Practitioner

## 2021-05-12 DIAGNOSIS — F323 Major depressive disorder, single episode, severe with psychotic features: Secondary | ICD-10-CM

## 2021-05-12 NOTE — Telephone Encounter (Signed)
Refill request sent to [facility] for approval

## 2021-05-13 NOTE — Progress Notes (Signed)
Virtual Visit via Telephone Note [facility]  I connected with Danielle Sweeney  on 05/14/2021 at 11:00 AM by telephone and verified that I am speaking with the correct person using two identifiers.  Location: Patient: Home Provider: [facility]   I discussed the limitations, risks, security and privacy concerns of performing an evaluation and management service by telephone and the availability of in person appointments. I also discussed with the patient that there may be a patient responsible charge related to this service. The patient expressed understanding and agreed to proceed.   HISTORY OF PRESENT ILLNESS: Ms. Danielle Sweeney was seen for initial consultation by Tarri Abernethy PA-C and Dr. Delton Coombes on 04/22/2021 for evaluation of microcytic anemia.  She returns today to discuss results of that work-up.  She reports feeling about the same, and has not had any change in her baseline health status, hospital stays, or new diagnoses since her last visit.  She continues to deny any obvious blood loss such as hematemesis, hematochezia, melena, or frank hematuria.  She does not take any iron supplements at home but does take daily multivitamin in addition to vitamin B12 and vitamin D.  Other symptoms as noted below in ROS.  She reports energy 100% with appetite 65%.    OBSERVATIONS/OBJECTIVE: Review of Systems  Constitutional:  Negative for chills, diaphoresis, fever, malaise/fatigue and weight loss.       Difficulty chewing  HENT:         Poor appetite  Respiratory:  Negative for cough and shortness of breath.   Cardiovascular:  Negative for chest pain and palpitations.  Gastrointestinal:  Positive for nausea. Negative for abdominal pain, blood in stool, melena and vomiting.  Genitourinary:  Positive for frequency.  Musculoskeletal:  Positive for back pain.  Neurological:  Positive for tingling. Negative for dizziness and headaches.   Psychiatric/Behavioral:  The patient is nervous/anxious.     PHYSICAL EXAM (per limitations of virtual telephone visit): The patient is alert and oriented x 3, exhibiting adequate mentation, good mood, and ability to speak in full sentences and execute sound judgement.   ASSESSMENT & PLAN: 1.  Microcytic anemia - Patient recently relocated from [facility], had started hematology work-up of microcytic anemia with Dr. Sherrian Divers in [place] (records via [place] personally reviewed by me).  CBC from 11/29/2020 showed Hgb 10.8/MCV 75.7. - Most recent iron panel (12/27/2020): Iron saturation 15%, ferritin 73 - Pathology smear review (12/27/2020): Mild microcytic anemia with occasional target cells, consider liver disease, hemoglobinopathy, or other etiology. - She reports family history of sickle cell disease in her father and several paternal aunts and uncles - Hematology work-up (04/22/2021) Hemoglobin fractionation cascade was unremarkable No reticulocytosis.  LDH normal. Ferritin 49 with iron saturation 33% and TIBC 450 Normal creatinine/GFR Folate, copper, B12/methylmalonic acid at goal SPEP, immunofixation, and light chains unremarkable. - CBC (04/22/2021) showed normal Hgb 12.8 with persistent microcytosis MCV 76.0/RBC 5.20 - No bright red blood per rectum or melena  - She reports that she had EGD and colonoscopy in January 2022, positive for colon polyp x1, no bleeding or ulcers per her report.  [place] records indicate that she had moderate esophagogastritis as well. - She reports longstanding history of microscopic hematuria, which is being followed by her PCP  - She does not take any iron at home - Although anemia has resolved, persistent microcytosis is consistent with iron deficiency.   - PLAN: Recommend iron supplementation with ferrous sulfate 325 mg  daily. - Patient reports that she has relocated back to [place] and she will reestablish care with her  hematologist there (Dr. Sherrian Divers). - We will defer any further work-up of her microcytosis, iron deficiency anemia, microscopic anemia, and family history of sickle cell to her hematologist in [place].  2.  Leukocytosis - Prior CBC from 11/29/2020 showed WBC 11.1/ANC 8.2. - Pathology smear review (12/27/2020): Mild leukocytosis with proportional increase of mature polys and polymorphous lymphocytes, appears reactive - Patient is a current every day smoker  - She also takes regular inhaled steroids  - She has intermittent axillary abscesses, most recently required antibiotics in December 2022 - She denies any B symptoms or new lumps or bumps - Most recent CBC (04/22/2021) showed normal WBC 8.0 with normal differential - Differential diagnosis favors reactive leukocytosis from tobacco use, inhaled steroids, and frequent subcutaneous abscesses. - PLAN: Recommend watchful waiting.  Would consider MPN work-up if any significant changes.   3.  Weight loss - She is on omeprazole for severe reflux esophagitis.  She reports that she has had ongoing GI issues since October 2021 with frequent nausea, reflux, poor appetite, and occasional diarrhea.  Due to her GI symptoms and lack of appetite, she reports that she has lost about 60 pounds in the past 15 months.  She reports that she had EGD and colonoscopy in January 2022, positive for colon polyp x1, no bleeding or ulcers per her report.  4.  Other history - SOCIAL: Smokes 5 to 10 cigars daily.  Drinks wine about once per week.  Denies any history of illicit drug use. - FAMILY: Multiple family members on her father side with sickle cell disease.  Maternal grandfather   FOLLOW UP INSTRUCTIONS: - No follow-up needed - patient relocated back to [place] and will follow-up with her providers there - Discharge from clinic    I discussed the assessment and treatment plan with the patient. The patient was provided an opportunity to ask questions and all were  answered. The patient agreed with the plan and demonstrated an understanding of the instructions.   The patient was advised to call back or seek an in-person evaluation if the symptoms worsen or if the condition fails to improve as anticipated.  I provided 13 minutes of non-face-to-face time during this encounter.   Harriett Rush, PA-C 05/14/2021 11:16 AM

## 2021-05-13 NOTE — Telephone Encounter (Signed)
Spoke with pt advised of Fola's message pt states that she will send another request once she comes back in town, states that she is in [place] due to a family emergency. Pt states that psych did call her yesterday but she will call them back today as she wasn't able to answer yesterday.

## 2021-05-13 NOTE — Telephone Encounter (Signed)
Patient aware this cannot be sent due to it being a controlled see previous msg

## 2021-05-14 ENCOUNTER — Other Ambulatory Visit: Payer: Self-pay

## 2021-05-14 ENCOUNTER — Inpatient Hospital Stay (HOSPITAL_COMMUNITY): Payer: Medicare Other | Attending: Hematology | Admitting: Physician Assistant

## 2021-05-14 DIAGNOSIS — Z832 Family history of diseases of the blood and blood-forming organs and certain disorders involving the immune mechanism: Secondary | ICD-10-CM

## 2021-05-14 DIAGNOSIS — D509 Iron deficiency anemia, unspecified: Secondary | ICD-10-CM | POA: Diagnosis not present

## 2021-07-01 ENCOUNTER — Ambulatory Visit: Payer: Medicare Other | Admitting: Nurse Practitioner

## 2021-07-14 DIAGNOSIS — N939 Abnormal uterine and vaginal bleeding, unspecified: Secondary | ICD-10-CM | POA: Insufficient documentation

## 2021-07-14 DIAGNOSIS — N841 Polyp of cervix uteri: Secondary | ICD-10-CM | POA: Insufficient documentation

## 2021-08-22 DIAGNOSIS — K458 Other specified abdominal hernia without obstruction or gangrene: Secondary | ICD-10-CM | POA: Insufficient documentation

## 2021-08-22 DIAGNOSIS — D259 Leiomyoma of uterus, unspecified: Secondary | ICD-10-CM | POA: Insufficient documentation

## 2021-08-22 DIAGNOSIS — F259 Schizoaffective disorder, unspecified: Secondary | ICD-10-CM | POA: Insufficient documentation

## 2021-11-12 DIAGNOSIS — G8929 Other chronic pain: Secondary | ICD-10-CM | POA: Insufficient documentation

## 2022-01-07 DIAGNOSIS — Z9889 Other specified postprocedural states: Secondary | ICD-10-CM | POA: Insufficient documentation

## 2022-04-07 IMAGING — MR MR LUMBAR SPINE W/O CM
5 series · 31 of 48 positions shown · non-contrast
Comparison: None.

CLINICAL DATA: Left-sided low back pain and leg pain

EXAM:
MRI LUMBAR SPINE WITHOUT CONTRAST
TECHNIQUE: Multiplanar, multisequence MR imaging of the lumbar spine was
performed. No intravenous contrast was administered.

[Series 5: T1 · sagittal · 4.0mm · 0.81mm/px · 6 of 17 slices shown (1 of 2)]
[im 1/17]
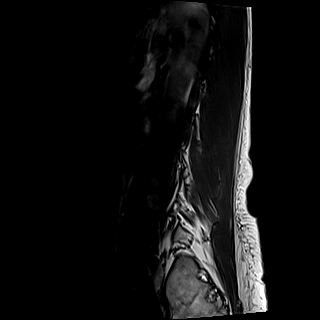
[im 4/17]
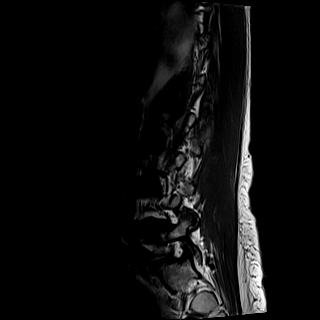
[im 7/17]
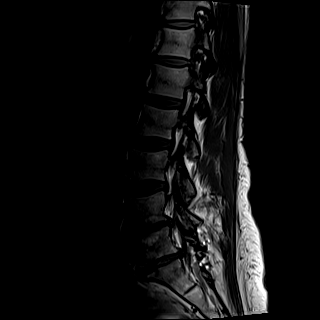
[im 10/17]
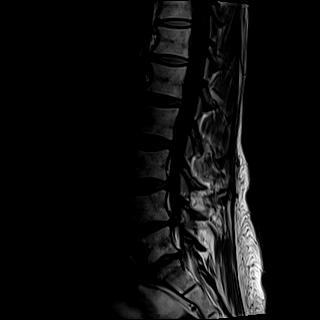
[im 13/17]
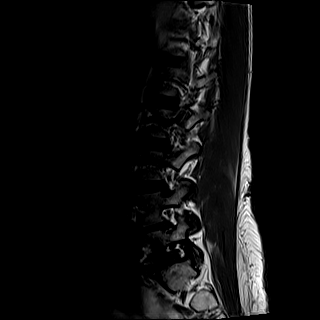
[im 17/17]
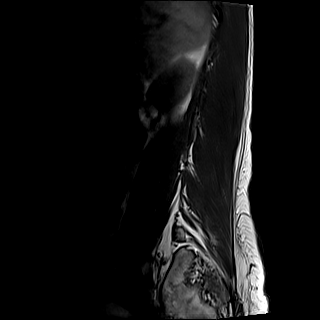

[Series 6: T2 · sagittal · 4.0mm · 0.81mm/px · 6 of 17 slices shown (1 of 2)]
[im 1/17]
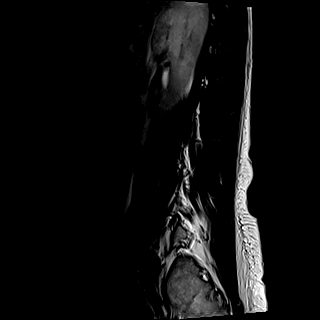
[im 4/17]
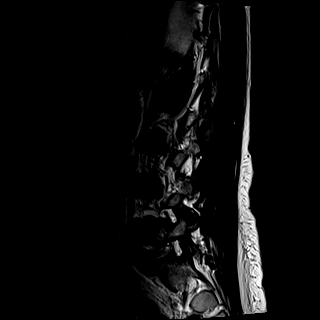
[im 7/17]
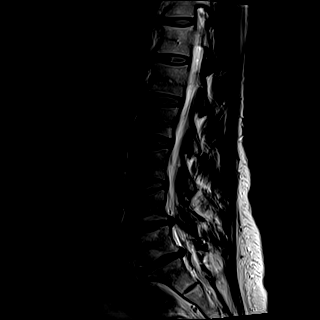
[im 10/17]
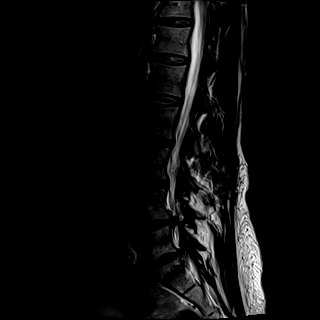
[im 13/17]
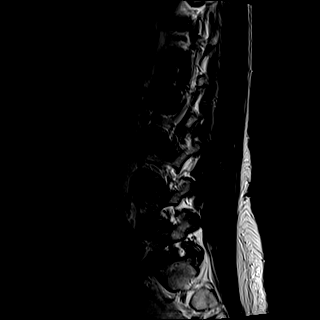
[im 17/17]
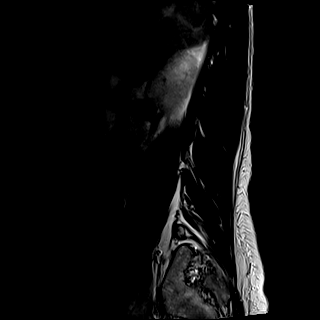

[Series 7: STIR · sagittal · 4.0mm · 0.51mm/px · 1 of 17 slices shown]
[im 1/17]
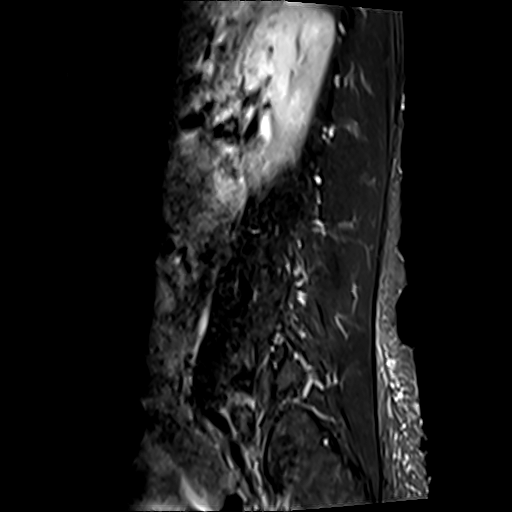

[Series 9: T1 · axial · 4.0mm · 0.39mm/px · z∈[-102,+92]mm · 9 of 40 slices shown (2 of 2)]
[im 1/40]
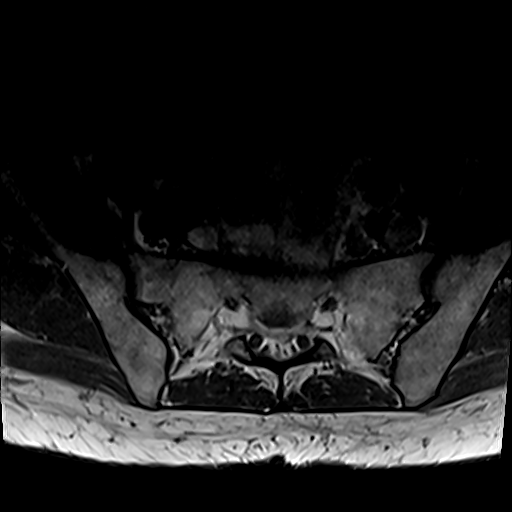
[im 6/40]
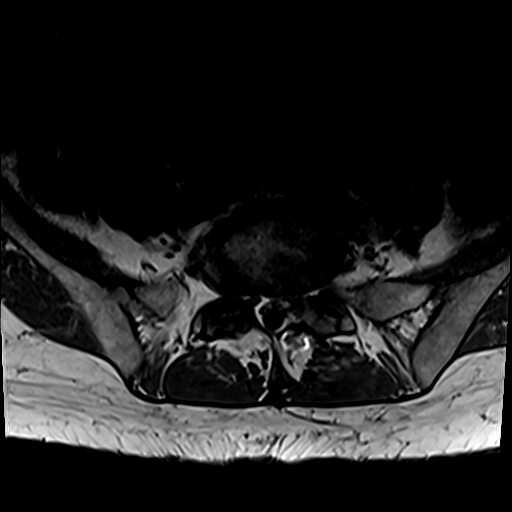
[im 12/40]
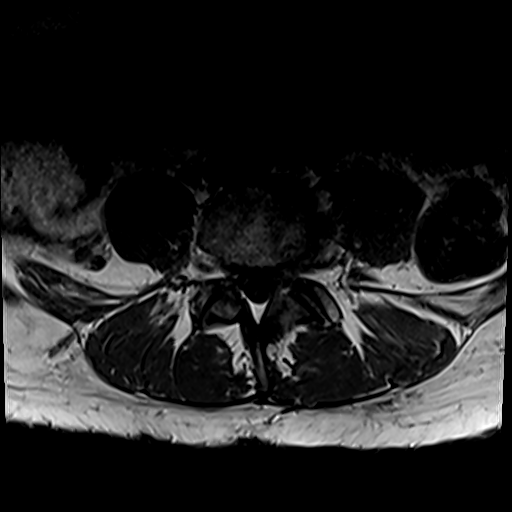
[im 17/40]
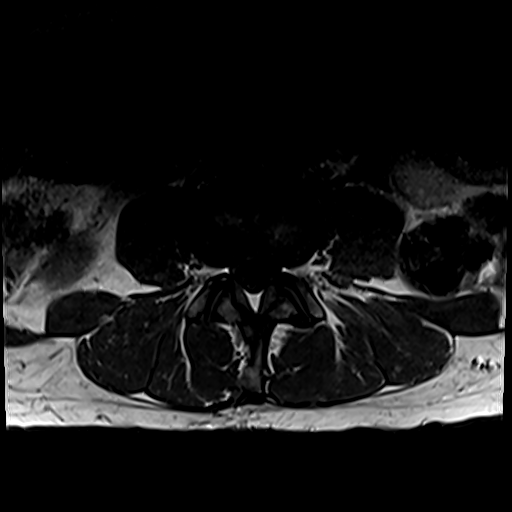
[im 20/40]
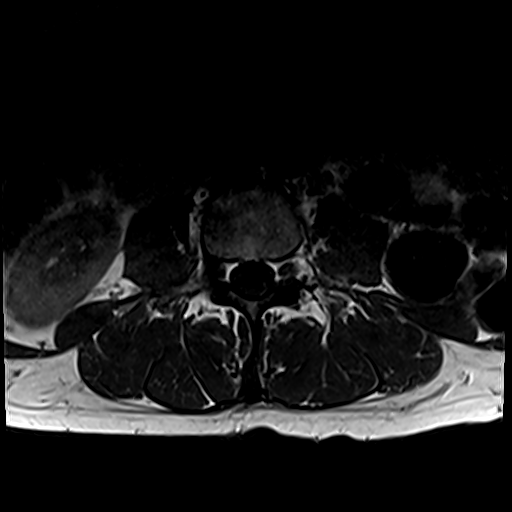
[im 23/40]
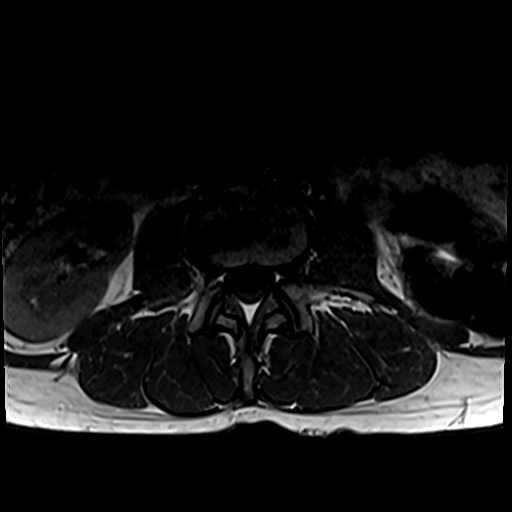
[im 28/40]
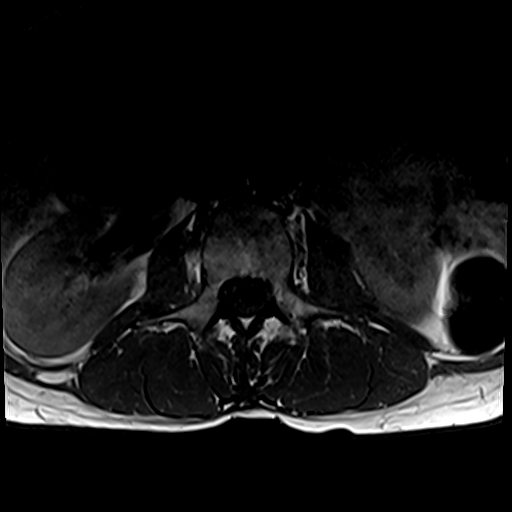
[im 34/40]
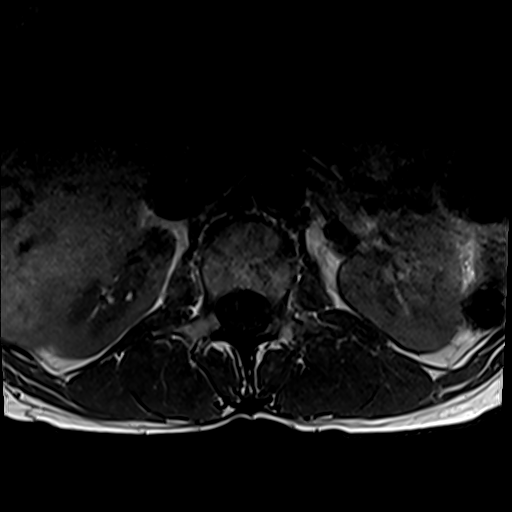
[im 40/40]
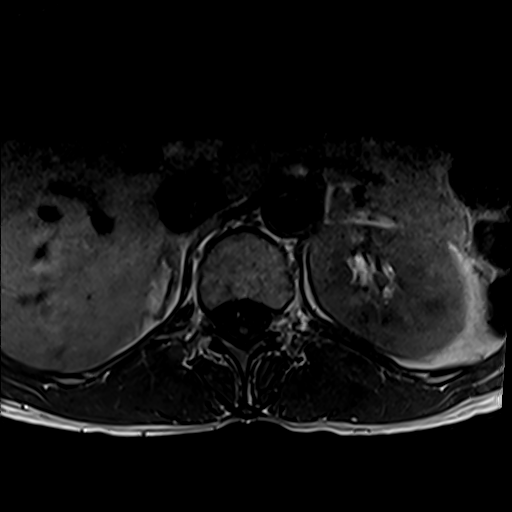

[Series 10: T2 · axial · 4.0mm · 0.62mm/px · z∈[-102,+92]mm · 9 of 40 slices shown (2 of 2)]
[im 1/40]
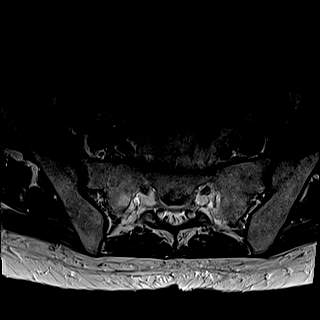
[im 6/40]
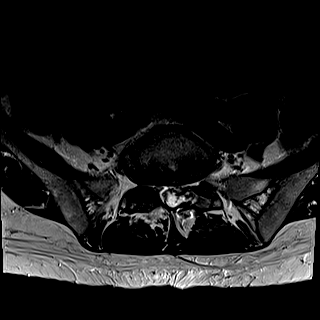
[im 12/40]
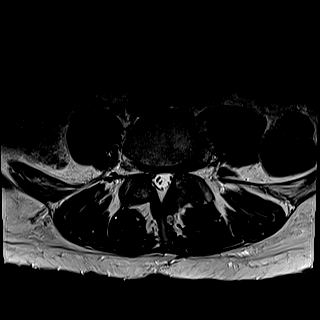
[im 17/40]
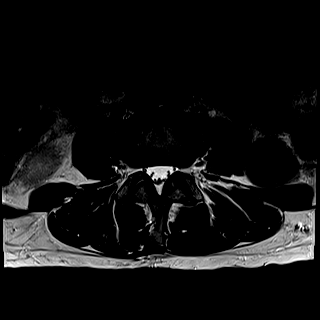
[im 20/40]
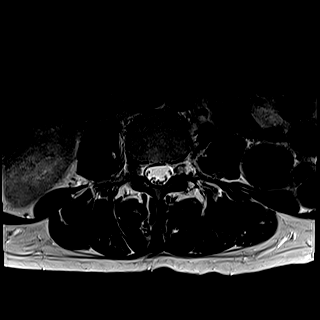
[im 23/40]
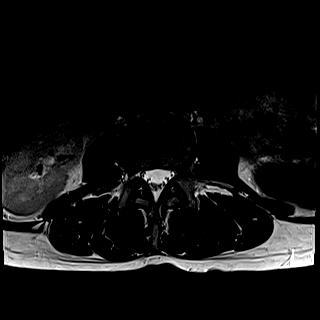
[im 28/40]
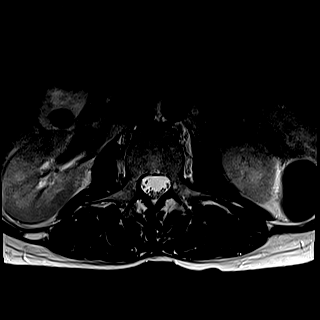
[im 34/40]
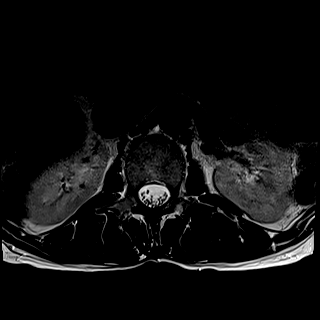
[im 40/40]
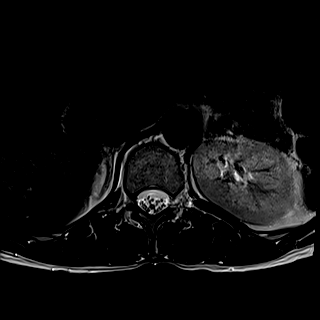

[31 of 48 positions shown; findings below may reference images not displayed]

FINDINGS: Segmentation:  Standard.

Alignment:  Physiologic.

Vertebrae: No acute fracture, evidence of discitis, or aggressive
bone lesion.

Conus medullaris and cauda equina: Conus extends to the T12 level.
Conus and cauda equina appear normal.

Paraspinal and other soft tissues: No acute paraspinal abnormality.

Disc levels:

Disc spaces: Degenerative disease with disc height loss at L4-5 and
L5-S1.

T12-L1: No significant disc bulge. No neural foraminal stenosis. No
central canal stenosis.

L1-L2: No significant disc bulge. No neural foraminal stenosis. No
central canal stenosis. Mild bilateral facet arthropathy.

L2-L3: No significant disc bulge. No neural foraminal stenosis. No
central canal stenosis.

L3-L4: No significant disc bulge. No neural foraminal stenosis. No
central canal stenosis. Mild bilateral facet arthropathy.

L4-L5: Broad-based disc bulge. Mild bilateral facet arthropathy.
Bilateral subarticular recess stenosis. No foraminal or central
canal stenosis.

L5-S1: Broad-based disc bulge. Left laminectomy. Mild epidural
fibrosis along the left lateral aspect of the thecal sac. No left
foraminal stenosis. Mild right foraminal stenosis. No central canal
stenosis. Moderate bilateral facet arthropathy.
IMPRESSION: 1. At L5-S1 there is a broad-based disc bulge. Left laminectomy.
Mild epidural fibrosis along the left lateral aspect of the thecal
sac. No left foraminal stenosis. Mild right foraminal stenosis.
Moderate bilateral facet arthropathy.
2. At L4-5 there is a broad-based disc bulge. Mild bilateral facet
arthropathy. Bilateral subarticular recess stenosis.
3.  No acute osseous injury of the lumbar spine.

## 2022-06-09 ENCOUNTER — Encounter: Payer: Self-pay | Admitting: Family Medicine

## 2022-06-09 ENCOUNTER — Ambulatory Visit (INDEPENDENT_AMBULATORY_CARE_PROVIDER_SITE_OTHER): Payer: Medicare Other | Admitting: Family Medicine

## 2022-06-09 VITALS — BP 111/54 | HR 85 | Ht 65.0 in | Wt 135.0 lb

## 2022-06-09 DIAGNOSIS — Z114 Encounter for screening for human immunodeficiency virus [HIV]: Secondary | ICD-10-CM

## 2022-06-09 DIAGNOSIS — G43E01 Chronic migraine with aura, not intractable, with status migrainosus: Secondary | ICD-10-CM

## 2022-06-09 DIAGNOSIS — Z1159 Encounter for screening for other viral diseases: Secondary | ICD-10-CM | POA: Diagnosis not present

## 2022-06-09 DIAGNOSIS — R7301 Impaired fasting glucose: Secondary | ICD-10-CM | POA: Diagnosis not present

## 2022-06-09 DIAGNOSIS — F419 Anxiety disorder, unspecified: Secondary | ICD-10-CM | POA: Diagnosis not present

## 2022-06-09 DIAGNOSIS — E039 Hypothyroidism, unspecified: Secondary | ICD-10-CM

## 2022-06-09 DIAGNOSIS — I10 Essential (primary) hypertension: Secondary | ICD-10-CM | POA: Diagnosis not present

## 2022-06-09 DIAGNOSIS — E785 Hyperlipidemia, unspecified: Secondary | ICD-10-CM

## 2022-06-09 DIAGNOSIS — Z8659 Personal history of other mental and behavioral disorders: Secondary | ICD-10-CM

## 2022-06-09 NOTE — Patient Instructions (Signed)
It was pleasure meeting with you today. Follow up with your primary health provider if any health concerns arises.  

## 2022-06-09 NOTE — Assessment & Plan Note (Addendum)
    06/09/2022    9:32 AM  GAD 7 : Generalized Anxiety Score  Nervous, Anxious, on Edge 3  Control/stop worrying 3  Worry too much - different things 2  Trouble relaxing 1  Restless 1  Easily annoyed or irritable 1  Afraid - awful might happen 0  Total GAD 7 Score 11  Anxiety Difficulty Somewhat difficult  Discussed about cognitive behavioral therapy focusing on thoughts, belief, and attitudes that affects feelings and behavior, learning about coping skills to deal with certain problems. Maintaining a consistent routine and schedule, Practice stress management and self calming techniques, excersise regularly and spend time outdoors, Do not eat food that are high in fat, added sugar, or salt. Referral to behavior health   Currently taking Ativan 1 mg prn Lamotrigine 100 mg, and Latuda 40 mg

## 2022-06-09 NOTE — Progress Notes (Signed)
New Patient Office Visit   Subjective   Patient ID: Danielle Sweeney, female    DOB: [birth year]  Age: 52 y.o. MRN: [id number]  CC:  Chief Complaint  Patient presents with   Anxiety    Patient complains of anxiety, would like referral to [facility].     HPI Danielle Sweeney 52 year old female, presents to establish care. She  has a past medical history of Bipolar 1 disorder ([place]), Depression, and Suicide attempt ([place]).  Anxiety Presents for initial visit. Patient reports anxiety has been gradually worsening. Symptoms include decreased concentration, depressed mood, excessive worry, irritability, muscle tension and nervous/anxious behavior. Symptoms occur most days. The severity of symptoms is moderate to severe at times, when severe patient reports taking ativan 1 mg. The symptoms are aggravated by social activities and specific phobias. The quality of sleep is poor. Nighttime awakenings are several. Risk factors include a major life event, prior traumatic experience and family history. Her past medical history is significant for anxiety/panic attacks, bipolar disorder and depression. Past treatments include counseling (CBT),  Ativan 1 mg as needed, Lamotrigine 100 mg, and Latuda 40 mg. The treatment provided mild relief. Compliance with prior treatments has been variable.       Outpatient Encounter Medications as of 06/09/2022  Medication Sig   benztropine (COGENTIN) 1 MG tablet Take 1 tablet (1 mg total) by mouth 2 (two) times daily.   budesonide-formoterol (SYMBICORT) 160-4.5 MCG/ACT inhaler Inhale into the lungs.   Efinaconazole (JUBLIA) 10 % SOLN    lamoTRIgine (LAMICTAL) 100 MG tablet TAKE 2 TABLETS BY ORAL ROUTE IN THE MORNING AND 2 TABLETS AT BEDTIME   linaclotide (LINZESS) 145 MCG CAPS capsule TAKE 1 CAPSULE BY MOUTH EVERY MORNING BEFORE BREAKFAST.   LORazepam (ATIVAN) 1 MG tablet Take 1 tablet (1 mg total) by mouth daily as needed for anxiety.   lumateperone tosylate (CAPLYTA) 42 MG  capsule Take 1 tablet by mouth daily.   lurasidone (LATUDA) 40 MG TABS tablet Take 40 mg by mouth daily.   omeprazole (PRILOSEC) 40 MG capsule Take 40 mg by mouth 2 (two) times daily.   ondansetron (ZOFRAN) 8 MG tablet TAKE 1 TABLET BY MOUTH 3 TIMES DAILY (EVERY 8 HOURS) AS NEEDED FOR NAUSEA OR VOMITING.   Prucalopride Succinate (MOTEGRITY) 2 MG TABS Take 1 tablet by mouth daily.   Varenicline Tartrate, Starter, 0.5 MG X 11 & 1 MG X 42 TBPK Take one 0.5 mg tab by mouth once daily for 3 days, then take one 0.5 mg tab twice daily for 4 days, then take one 1 mg tab twice daily.   XIIDRA 5 % SOLN    AJOVY 225 MG/1.5ML SOSY Inject into the skin. (Patient not taking: Reported on 06/09/2022)   famotidine (PEPCID) 40 MG tablet Take 1 tablet (40 mg total) by mouth daily.   Multiple Vitamin (MULTIVITAMIN) tablet Take 1 tablet by mouth daily. (Patient not taking: Reported on 06/09/2022)   Sertraline HCl 200 MG CAPS Take 1 capsule by mouth every morning. (Patient not taking: Reported on 06/09/2022)   SUMAtriptan (IMITREX) 100 MG tablet TAKE 1 TABLET(100 MG) BY MOUTH DAILY AS NEEDED FOR MIGRAINE (Patient not taking: Reported on 06/09/2022)   [DISCONTINUED] Cholecalciferol 10 MCG (400 UNIT) CAPS Take 1 capsule by mouth daily.   [DISCONTINUED] cyanocobalamin 1000 MCG tablet Take 1,000 mcg by mouth daily.   [DISCONTINUED] famotidine (PEPCID) 40 MG tablet TAKE 1 TABLET(40 MG) BY MOUTH DAILY   [DISCONTINUED] linaclotide (LINZESS) 290 MCG CAPS capsule  Take 1 capsule (290 mcg total) by mouth daily before breakfast.   [DISCONTINUED] methylPREDNISolone (MEDROL DOSEPAK) 4 MG TBPK tablet See admin instructions. follow package directions   [DISCONTINUED] nicotine (NICODERM CQ - DOSED IN MG/24 HR) 7 mg/24hr patch Place 1 patch (7 mg total) onto the skin daily.   [DISCONTINUED] propranolol (INDERAL) 10 MG tablet Take 1 tablet (10 mg total) by mouth daily.   [DISCONTINUED] traMADol (ULTRAM) 50 MG tablet Take 50-100 mg by mouth 3  (three) times daily as needed.   [DISCONTINUED] trazodone (DESYREL) 300 MG tablet Take 1 tablet (300 mg total) by mouth at bedtime.   No facility-administered encounter medications on file as of 06/09/2022.    Past Surgical History:  Procedure Laterality Date   BACK SURGERY Bilateral 2015   BUNIONECTOMY     LIPOMA EXCISION      Review of Systems  Constitutional:  Negative for chills and fever.  Respiratory:  Negative for shortness of breath.   Cardiovascular:  Negative for chest pain.  Gastrointestinal:  Negative for nausea and vomiting.  Genitourinary:  Negative for dysuria.  Neurological:  Negative for dizziness.  Psychiatric/Behavioral:  The patient is nervous/anxious.       Objective    BP (!) 111/54   Pulse 85   Ht 5\' 5"  (1.651 m)   Wt 135 lb (61.2 kg)   SpO2 98%   BMI 22.47 kg/m   Physical Exam Cardiovascular:     Rate and Rhythm: Normal rate.     Pulses: Normal pulses.  Pulmonary:     Effort: Pulmonary effort is normal.     Breath sounds: Normal breath sounds.  Abdominal:     General: Bowel sounds are normal.     Palpations: Abdomen is soft. There is no mass.     Tenderness: There is no right CVA tenderness, left CVA tenderness or guarding.  Skin:    General: Skin is warm and dry.     Capillary Refill: Capillary refill takes less than 2 seconds.  Neurological:     General: No focal deficit present.     Mental Status: She is alert.     Coordination: Coordination normal.     Gait: Gait normal.  Psychiatric:        Mood and Affect: Mood normal.        Thought Content: Thought content normal.       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, not intractable -     Ambulatory referral to Neurology  Primary hypertension -     CBC with Differential/Platelet -     CMP14+EGFR -     Microalbumin / creatinine urine ratio  IFG (impaired fasting glucose) -     Hemoglobin A1c  Hyperlipidemia, unspecified hyperlipidemia  type -     Lipid panel  Hypothyroidism, unspecified type -     TSH + free T4  Hx of bipolar disorder -     Amb ref to [facility] for HIV (human immunodeficiency virus) -     HIV Antibody (routine testing w rflx)  Need for hepatitis C screening test -     Hepatitis C antibody  Anxiety Assessment & Plan:    06/09/2022    9:32 AM  GAD 7 : Generalized Anxiety Score  Nervous, Anxious, on Edge 3  Control/stop worrying 3  Worry too much - different things 2  Trouble relaxing 1  Restless 1  Easily annoyed or irritable 1  Afraid - awful might happen 0  Total GAD 7 Score 11  Anxiety Difficulty Somewhat difficult  Discussed about cognitive behavioral therapy focusing on thoughts, belief, and attitudes that affects feelings and behavior, learning about coping skills to deal with certain problems. Maintaining a consistent routine and schedule, Practice stress management and self calming techniques, excersise regularly and spend time outdoors, Do not eat food that are high in fat, added sugar, or salt. Referral to behavior health   Currently taking Ativan 1 mg prn Lamotrigine 100 mg, and Latuda 40 mg     Return in about 1 month (around 07/10/2022) for Pap smear.   Renard Hamper Ria Comment, FNP

## 2022-06-10 ENCOUNTER — Other Ambulatory Visit: Payer: Self-pay | Admitting: Family Medicine

## 2022-06-10 ENCOUNTER — Other Ambulatory Visit: Payer: Self-pay

## 2022-06-10 DIAGNOSIS — D509 Iron deficiency anemia, unspecified: Secondary | ICD-10-CM

## 2022-06-10 DIAGNOSIS — R7303 Prediabetes: Secondary | ICD-10-CM

## 2022-06-11 ENCOUNTER — Ambulatory Visit (INDEPENDENT_AMBULATORY_CARE_PROVIDER_SITE_OTHER): Payer: Medicare Other | Admitting: Gastroenterology

## 2022-06-11 ENCOUNTER — Encounter (INDEPENDENT_AMBULATORY_CARE_PROVIDER_SITE_OTHER): Payer: Self-pay | Admitting: Gastroenterology

## 2022-06-11 VITALS — BP 129/81 | HR 68 | Temp 98.1°F | Ht 65.0 in | Wt 137.5 lb

## 2022-06-11 DIAGNOSIS — R197 Diarrhea, unspecified: Secondary | ICD-10-CM | POA: Diagnosis not present

## 2022-06-11 DIAGNOSIS — R140 Abdominal distension (gaseous): Secondary | ICD-10-CM

## 2022-06-11 DIAGNOSIS — R110 Nausea: Secondary | ICD-10-CM | POA: Diagnosis not present

## 2022-06-11 DIAGNOSIS — K219 Gastro-esophageal reflux disease without esophagitis: Secondary | ICD-10-CM | POA: Diagnosis not present

## 2022-06-11 LAB — CMP14+EGFR
ALT: 25 IU/L (ref 0–32)
AST: 27 IU/L (ref 0–40)
Albumin/Globulin Ratio: 1.7 (ref 1.2–2.2)
Albumin: 4.7 g/dL (ref 3.8–4.9)
Alkaline Phosphatase: 56 IU/L (ref 44–121)
BUN/Creatinine Ratio: 10 (ref 9–23)
BUN: 10 mg/dL (ref 6–24)
Bilirubin Total: <0.2 mg/dL (ref 0.0–1.2)
CO2: 23 mmol/L (ref 20–29)
Calcium: 10.1 mg/dL (ref 8.7–10.2)
Chloride: 99 mmol/L (ref 96–106)
Creatinine, Ser: 0.97 mg/dL (ref 0.57–1.00)
Globulin, Total: 2.7 g/dL (ref 1.5–4.5)
Glucose: 98 mg/dL (ref 70–99)
Potassium: 4.1 mmol/L (ref 3.5–5.2)
Sodium: 139 mmol/L (ref 134–144)
Total Protein: 7.4 g/dL (ref 6.0–8.5)
eGFR: 70 mL/min/{1.73_m2} (ref >59)

## 2022-06-11 LAB — CBC WITH DIFFERENTIAL/PLATELET
Basophils Absolute: 0 10*3/uL (ref 0.0–0.2)
Basos: 0 %
EOS (ABSOLUTE): 0 10*3/uL (ref 0.0–0.4)
Eos: 1 %
Hematocrit: 36.6 % (ref 34.0–46.6)
Hemoglobin: 11 g/dL — ABNORMAL LOW (ref 11.1–15.9)
Immature Grans (Abs): 0 10*3/uL (ref 0.0–0.1)
Immature Granulocytes: 0 %
Lymphocytes Absolute: 1.9 10*3/uL (ref 0.7–3.1)
Lymphs: 37 %
MCH: 22.6 pg — ABNORMAL LOW (ref 26.6–33.0)
MCHC: 30.1 g/dL — ABNORMAL LOW (ref 31.5–35.7)
MCV: 75 fL — ABNORMAL LOW (ref 79–97)
Monocytes Absolute: 0.3 10*3/uL (ref 0.1–0.9)
Monocytes: 6 %
Neutrophils Absolute: 2.9 10*3/uL (ref 1.4–7.0)
Neutrophils: 56 %
Platelets: 252 10*3/uL (ref 150–450)
RBC: 4.87 x10E6/uL (ref 3.77–5.28)
RDW: 15.8 % — ABNORMAL HIGH (ref 11.7–15.4)
WBC: 5.1 10*3/uL (ref 3.4–10.8)

## 2022-06-11 LAB — MICROALBUMIN / CREATININE URINE RATIO
Creatinine, Urine: 30.4 mg/dL
Microalb/Creat Ratio: 12 mg/g creat (ref 0–29)
Microalbumin, Urine: 3.5 ug/mL

## 2022-06-11 LAB — LIPID PANEL
Chol/HDL Ratio: 3.2 ratio (ref 0.0–4.4)
Cholesterol, Total: 218 mg/dL — ABNORMAL HIGH (ref 100–199)
HDL: 69 mg/dL (ref >39)
LDL Chol Calc (NIH): 133 mg/dL — ABNORMAL HIGH (ref 0–99)
Triglycerides: 93 mg/dL (ref 0–149)
VLDL Cholesterol Cal: 16 mg/dL (ref 5–40)

## 2022-06-11 LAB — TSH+FREE T4
Free T4: 1.59 ng/dL (ref 0.82–1.77)
TSH: 1.47 u[IU]/mL (ref 0.450–4.500)

## 2022-06-11 LAB — HEMOGLOBIN A1C
Est. average glucose Bld gHb Est-mCnc: 126 mg/dL
Hgb A1c MFr Bld: 6 % — ABNORMAL HIGH (ref 4.8–5.6)

## 2022-06-11 LAB — HIV ANTIBODY (ROUTINE TESTING W REFLEX): HIV Screen 4th Generation wRfx: NONREACTIVE

## 2022-06-11 LAB — HEPATITIS C ANTIBODY: Hep C Virus Ab: NONREACTIVE

## 2022-06-11 MED ORDER — CHOLESTYRAMINE 4 G PO PACK: 4.0000 g | PACK | Freq: Two times a day (BID) | ORAL | 1 refills | Status: DC

## 2022-06-11 NOTE — Patient Instructions (Addendum)
We can try questran 4g twice daily for your diarrhea Please continue to take omeprazole 40mg  twice daily and famotidine 40mg  in the evening  I will review all of your records and send referral for pelvic floor physical therapist  Follow up 6 weeks

## 2022-06-11 NOTE — Progress Notes (Addendum)
Referring Provider: No ref. provider found Primary Care Physician:  Juanda Chance, FNP Primary GI Physician:  new   Chief Complaint  Patient presents with   Constipation    Constipation. Has tried linzess and motegrity. Can go up to two weeks at a time before having BM.    Gastroesophageal Reflux   HPI:   Neriya Meckstroth is a 52 y.o. female with past medical history of Bipolar 1 disorder, depression, GERD, anorexia, IBS-M  Patient presenting today as a new patient for diarrhea, abdominal pain, nausea and GERD  Recent labs with normal thyroid function, CMP, h pylori breath test negative  Per review of records, Constipation with intermittent diarrhea since her 30s, has been on linzess and motegrity together. Did not tolerate linzess 254mcg as it caused diarrhea. She has even had a trial of xifaxan for SIBO treatment, did not tolerate xifaxan. Extensive workup for her symptoms over the past few years at her previous GI office. she underwent EGD and Colonoscopy that were mostly unremarkable other than gastritis and tubualar adenomas. Has had GES that was normal as well as CT imaging. Has been tried on FDgard in the past for nausea. Was also on amitiza 55mcg BID at one point    Celiac panel 06/2020 negative GES:11/2020 normal UGIS: 06/2020 normal PIllcam: unremarkable CT A/P 05/2020 with constiaption H pylori breath test 03/2022 negative  History is somewhat difficult to obtain from the patient today. She reports she can go up to two weeks without a BM. She was initially given something for constipation which caused a yeast infection (xifaxan, then did linzess and motegrity together which helped some but then she began having weight loss about 2 years ago,   She notes history of gallstones, has had GB removed maybe last month. Previously on linzess 128mcg and motegrity 2mg  daily together. Felt constipation was well controlled at that time, up until she had GB removed. She had stopped  these medications 2 weeks prior to GB removal.  She is now having mostly only diarrhea. She tells me she had what sounds to be ARM though per note review, this was mentioned but I do not have any records of the results for this. She is no longer taking linzess or motegrity, now having diarrhea, usually 4 BMs per day, though has been up to 7 per day since GB removal. Certain foods such as sphaghetti and grapes cause worse diarrhea. She has some abdominal discomfort around her umbilicus near one of her GB incisions. She notes that she continues to feel full despite going to the restroom, also chronic. She never quite feels like she has emptied out. She is not taking anything for diarrhea. She denies any recent antibiotics. She has had her latuda increased and started on ajovy as well recently. No rectal bleeding or melena.   GERD: taking omeprazole 40mg  BID, famotidine 40mg  in the morning as well. She is not really having any heartburn or acid reflux. She notes abdominal pain. She has improvement of pain with PPI and H2B  NSAID use: none  Social hx: she does vape, etoh on occasion  Fam hx:no liver disease, CRC or pancreatic cancer    Last Colonoscopy:03/2020, tubular adenoma  Last Endoscopy:03/2020 chronic gastirits  Recommendations:  Repeat TCS 5 years   Past Medical History:  Diagnosis Date   Bipolar 1 disorder ([place])    Depression    GERD (gastroesophageal reflux disease)    H/O anorexia nervosa    Suicide attempt ([place])  Past Surgical History:  Procedure Laterality Date   BACK SURGERY Bilateral 2015   BUNIONECTOMY     CHOLECYSTECTOMY     LIPOMA EXCISION      Current Outpatient Medications  Medication Sig Dispense Refill   AJOVY 225 MG/1.5ML SOSY Inject into the skin. (Patient not taking: Reported on 06/09/2022)     benztropine (COGENTIN) 1 MG tablet Take 1 tablet (1 mg total) by mouth 2 (two) times daily. 60 tablet 0   budesonide-formoterol (SYMBICORT) 160-4.5 MCG/ACT inhaler  Inhale into the lungs.     Efinaconazole (JUBLIA) 10 % SOLN      famotidine (PEPCID) 40 MG tablet Take 1 tablet (40 mg total) by mouth daily. 30 tablet 3   lamoTRIgine (LAMICTAL) 100 MG tablet TAKE 2 TABLETS BY ORAL ROUTE IN THE MORNING AND 2 TABLETS AT BEDTIME 120 tablet 0   linaclotide (LINZESS) 145 MCG CAPS capsule TAKE 1 CAPSULE BY MOUTH EVERY MORNING BEFORE BREAKFAST.     LORazepam (ATIVAN) 1 MG tablet Take 1 tablet (1 mg total) by mouth daily as needed for anxiety. 30 tablet 0   lumateperone tosylate (CAPLYTA) 42 MG capsule Take 1 tablet by mouth daily.     lurasidone (LATUDA) 40 MG TABS tablet Take 40 mg by mouth daily.     Multiple Vitamin (MULTIVITAMIN) tablet Take 1 tablet by mouth daily. (Patient not taking: Reported on 06/09/2022)     omeprazole (PRILOSEC) 40 MG capsule Take 40 mg by mouth 2 (two) times daily.     ondansetron (ZOFRAN) 8 MG tablet TAKE 1 TABLET BY MOUTH 3 TIMES DAILY (EVERY 8 HOURS) AS NEEDED FOR NAUSEA OR VOMITING.     Prucalopride Succinate (MOTEGRITY) 2 MG TABS Take 1 tablet by mouth daily.     Sertraline HCl 200 MG CAPS Take 1 capsule by mouth every morning. (Patient not taking: Reported on 06/09/2022)     SUMAtriptan (IMITREX) 100 MG tablet TAKE 1 TABLET(100 MG) BY MOUTH DAILY AS NEEDED FOR MIGRAINE (Patient not taking: Reported on 06/09/2022) 30 tablet 0   Varenicline Tartrate, Starter, 0.5 MG X 11 & 1 MG X 42 TBPK Take one 0.5 mg tab by mouth once daily for 3 days, then take one 0.5 mg tab twice daily for 4 days, then take one 1 mg tab twice daily.     XIIDRA 5 % SOLN      No current facility-administered medications for this visit.    Allergies as of 06/11/2022 - Review Complete 06/09/2022  Allergen Reaction Noted   Codeine Itching 11/21/2015   Percocet [oxycodone-acetaminophen] Itching 11/21/2015    Family History  Problem Relation Age of Onset   Heart failure Mother    Sickle cell anemia Father    Asthma Brother    Heart attack Maternal Grandmother     Diabetes type II Maternal Grandmother    Lung cancer Maternal Grandfather    Breast cancer Neg Hx    Colon cancer Neg Hx     Social History   Socioeconomic History   Marital status: Legally Separated    Spouse name: Not on file   Number of children: Not on file   Years of education: Not on file   Highest education level: Not on file  Occupational History   Not on file  Tobacco Use   Smoking status: Some Days    Types: Cigarettes   Smokeless tobacco: Never   Tobacco comments:    Started smoking cigarettes 5 years ago. Smokes 12 sticks a day.  Currently smoking 4 cigarettes daily.  Vaping Use   Vaping Use: Never used  Substance and Sexual Activity   Alcohol use: Yes    Comment: occasional   Drug use: No   Sexual activity: Not Currently    Birth control/protection: I.U.D.  Other Topics Concern   Not on file  Social History Narrative   Lives with her mother, on disability for mental health and chronic back pain   Social Determinants of Health   Financial Resource Strain: Not on file  Food Insecurity: Not on file  Transportation Needs: Not on file  Physical Activity: Not on file  Stress: Not on file  Social Connections: Not on file   Review of systems General: negative for malaise, night sweats, fever, chills, weight loss Neck: Negative for lumps, goiter, pain and significant neck swelling Resp: Negative for cough, wheezing, dyspnea at rest CV: Negative for chest pain, leg swelling, palpitations, orthopnea GI: denies melena, hematochezia, vomiting, constipation, dysphagia, odyonophagia, early satiety or unintentional weight loss. +diarrhea +nausea +bloating +abdominal pain MSK: Negative for joint pain or swelling, back pain, and muscle pain. Derm: Negative for itching or rash Psych: Denies depression, anxiety, memory loss, confusion. No homicidal or suicidal ideation.  Heme: Negative for prolonged bleeding, bruising easily, and swollen nodes. Endocrine: Negative for  cold or heat intolerance, polyuria, polydipsia and goiter. Neuro: negative for tremor, gait imbalance, syncope and seizures. The remainder of the review of systems is noncontributory.  Physical Exam: There were no vitals taken for this visit. General:   Alert and oriented. No distress noted. Pleasant and cooperative.  Head:  Normocephalic and atraumatic. Eyes:  Conjuctiva clear without scleral icterus. Mouth:  Oral mucosa pink and moist. Good dentition. No lesions. Heart: Normal rate and rhythm, s1 and s2 heart sounds present.  Lungs: Clear lung sounds in all lobes. Respirations equal and unlabored. Abdomen:  +BS, soft, non-tender and non-distended. No rebound or guarding. No HSM or masses noted. Derm: No palmar erythema or jaundice Msk:  Symmetrical without gross deformities. Normal posture. Extremities:  Without edema. Neurologic:  Alert and  oriented x4 Psych:  Alert and cooperative. Normal mood and affect.  Invalid input(s): "6 MONTHS"   ASSESSMENT: Kaeli Deforest is a 52 y.o. female presenting today as a new patient for diarrhea, abdominal pain, nausea and GERD.  IBS-M: previously with mostly constipation, requiring dual treatment with linzess 169mcg and motegrity 2mg  with mostly good control of her constipation until she stopped meds about 2 weeks prior to her cholecystectomy, has been having looser stools since GB removal. as patient is having new onset diarrhea since GB removal, suspect this may be secondary to bile acid diarrhea in setting of recent cholecystectomy. Can trial questran 4g BID for now to see if symptoms improve. She tells me she had anorectal manometry in February in [place] and was recommended to see a pelvic floor physical therapist, though I do not have these records, will try to obtain these from her previous GI before referring to PFT.   Bloating/Nausea:Notably patient tells me she continues to have a lot of bloating and nausea, she was never formally  tested for SIBO in the past and course of xifaxan was not completed as she did not tolerate the medication, I think it would be helpful to rule out SIBO as a contributing factor for her symptoms as she has had thorough workup otherwise, as outlined above without findings to explain her issues.   GERD/gastritis: she is not having acid reflux symptoms on  PPI/H2B, has some abdominal discomfort, gastritis on EGD in 2022, for now will continue on PPI and H2B. Further evaluation with SIBO testing, as above to rule this out as contributing factor.    PLAN:  Continue omprazole 40mg  BID, continue pepcid 40mg   2.  Rx questran 4g BID  3. Obtain anorectal manometry results-referral to PFT at [facility] if necessary 4. SIBO breath testing  All questions were answered, patient verbalized understanding and is in agreement with plan as outlined above.    Follow Up: 6 weeks   Ashmi Blas L. Alver Sorrow, MSN, APRN, AGNP-C Adult-Gerontology Nurse Practitioner [facility]   I have reviewed the note and agree with the APP's assessment as described in this progress note  Per notes from previous GI, had anorectal manometry study: 04/22/2022-normal resting anal sphincter pressure with good squeeze. Paradoxical anal sphincter contraction with poor intrarectal pressure generation during attempted defecation consistent with dyssynergia. Failed balloon expulsion test.  Patient now presenting diarrhea, unclear if this is related to overflow diarrhea in the setting of paradoxical contraction.   If presenting persisting symptoms on follow up, may consider referral to [facility] for pelvic floor/biofeedback  Maylon Peppers, MD Gastroenterology and [facility]

## 2022-06-16 ENCOUNTER — Encounter (INDEPENDENT_AMBULATORY_CARE_PROVIDER_SITE_OTHER): Payer: Self-pay

## 2022-06-16 ENCOUNTER — Encounter: Payer: Self-pay | Admitting: Family Medicine

## 2022-06-17 ENCOUNTER — Ambulatory Visit (INDEPENDENT_AMBULATORY_CARE_PROVIDER_SITE_OTHER): Payer: Self-pay | Admitting: Licensed Clinical Social Worker

## 2022-06-17 ENCOUNTER — Telehealth (INDEPENDENT_AMBULATORY_CARE_PROVIDER_SITE_OTHER): Payer: Self-pay | Admitting: Gastroenterology

## 2022-06-17 DIAGNOSIS — R140 Abdominal distension (gaseous): Secondary | ICD-10-CM | POA: Insufficient documentation

## 2022-06-17 DIAGNOSIS — F250 Schizoaffective disorder, bipolar type: Secondary | ICD-10-CM

## 2022-06-17 DIAGNOSIS — R110 Nausea: Secondary | ICD-10-CM | POA: Insufficient documentation

## 2022-06-17 DIAGNOSIS — R197 Diarrhea, unspecified: Secondary | ICD-10-CM | POA: Insufficient documentation

## 2022-06-17 NOTE — Telephone Encounter (Signed)
[place], spoke to [facility] [place], she told me patient has test done end of January but it hadn't been read yet and it was incomplete and I would have to speak to medical records, she transferred me to them and no one picked, will try again tomorrow

## 2022-06-17 NOTE — Telephone Encounter (Signed)
I connected via telephone with the patient in regards to her mychart message. I have reviewed her records from [place]. Extensive testing done as outlined in her OV note. She just started [place] today, will continue on this for the next 1-2 weeks to see if any improvement in her diarrhea. As she continues to have nausea and bloating, will refer to [facility] for SIBO testing. Will need to obtain records of ARM from her previous GI as they did not provide this to me, once I have these I can refer to pelvic floor therapist as needed. All questions were answered, patient verbalized understanding and is in agreement with plan as outlined above.

## 2022-06-18 NOTE — Telephone Encounter (Signed)
Tried called [facility] again, no answer, called main number to get fax#, I will fax release

## 2022-06-19 ENCOUNTER — Encounter: Payer: Self-pay | Admitting: Family Medicine

## 2022-06-22 NOTE — BH Specialist Note (Signed)
Integrated Behavioral Health via Telemedicine Visit  06/22/2022 Danielle Sweeney [id number]  Number of Integrated Behavioral Health Clinician visits: 1 Session Start time:  9:15am Session End time: 9:35am Total time in minutes: 13mins via mychart video   Referring Provider: Bronson Ing NP  Patient/Family location: Home  [facility] Provider location: [facility]  All persons participating in visit: Pt Danielle Sweeney and LCSW Danielle Sweeney  Types of Service: Video visit and [place] ([facility])  I connected with Danielle Sweeney and/or Danielle Sweeney n/Danielle via  Telephone or [employer]  (Video is Tree surgeon) and verified that I am speaking with the correct person using two identifiers. Discussed confidentiality: Yes   I discussed the limitations of telemedicine and the availability of in person appointments.  Discussed there is Danielle possibility of technology failure and discussed alternative modes of communication if that failure occurs.  I discussed that engaging in this telemedicine visit, they consent to the provision of behavioral healthcare and the services will be billed under their insurance.  Patient and/or legal guardian expressed understanding and consented to Telemedicine visit: Yes   Presenting Concerns: Patient and/or family reports the following symptoms/concerns: hx of depression and medication mgmt  Duration of problem: over one year ; Severity of problem: mild  Patient and/or Family's Strengths/Protective Factors: Concrete supports in place (healthy food, safe environments, etc.)  Goals Addressed: Patient will:  Reduce symptoms of: depression   Increase knowledge and/or ability of:  access to med mgmt per pt request    Demonstrate ability to: Increase healthy adjustment to current life circumstances  Progress towards Goals: Ongoing  Interventions: Interventions utilized:  Link to [employer] Standardized Assessments  completed: Not Needed  Patient and/or Family Response: Pt was currently in [place] during visit. Pt request assistance with medication mgmt. Pt advised referral to psych will assist with medication mgmt.   Assessment: Patient reports history of depression and requesting assistance with med mgmt. Pt visiting family in [place] LCSW will reschedule when pt is back in [place].    Patient may benefit from ibh and med mgmt.  Plan: Follow up with behavioral health clinician on : 07/16/2021 Behavioral recommendations: n/Danielle Referral(s): Psychiatrist  I discussed the assessment and treatment plan with the patient and/or parent/guardian. They were provided an opportunity to ask questions and all were answered. They agreed with the plan and demonstrated an understanding of the instructions.   They were advised to call back or seek an in-person evaluation if the symptoms worsen or if the condition fails to improve as anticipated.  Danielle Ferrier, LCSW

## 2022-06-23 NOTE — Telephone Encounter (Signed)
Spoke to patient this morning, she is in [place] and will reach out to GI office and have them fax [place] that report

## 2022-07-04 ENCOUNTER — Other Ambulatory Visit (INDEPENDENT_AMBULATORY_CARE_PROVIDER_SITE_OTHER): Payer: Self-pay | Admitting: Gastroenterology

## 2022-07-06 NOTE — Telephone Encounter (Signed)
Last seen 06/11/22. Requesting 90 day supply

## 2022-07-17 ENCOUNTER — Ambulatory Visit (INDEPENDENT_AMBULATORY_CARE_PROVIDER_SITE_OTHER): Payer: Medicare Other | Admitting: Family Medicine

## 2022-07-17 ENCOUNTER — Encounter: Payer: Self-pay | Admitting: Licensed Clinical Social Worker

## 2022-07-17 VITALS — BP 143/83 | HR 72 | Resp 16 | Ht 65.0 in | Wt 139.1 lb

## 2022-07-17 DIAGNOSIS — R079 Chest pain, unspecified: Secondary | ICD-10-CM | POA: Diagnosis not present

## 2022-07-17 DIAGNOSIS — R234 Changes in skin texture: Secondary | ICD-10-CM

## 2022-07-17 DIAGNOSIS — D509 Iron deficiency anemia, unspecified: Secondary | ICD-10-CM | POA: Diagnosis not present

## 2022-07-17 MED ORDER — NITROGLYCERIN 0.4 MG SL SUBL: 0.4000 mg | SUBLINGUAL_TABLET | SUBLINGUAL | 3 refills | Status: DC | PRN

## 2022-07-17 NOTE — Assessment & Plan Note (Signed)
Vitals:   07/17/22 0944  BP: (!) 143/83  Blood pressure not controlled in today's visit Advise to follow up in 2 weeks with at home blood pressure readings EKG ordered- awaiting results will follow up Prescribed Nitroglycerin 0.4 mg SL Advise patient to limit caffeine intake, avoid fatty foods, reduce alcohol intake, mediation to prevent anxiety attacks.

## 2022-07-17 NOTE — Patient Instructions (Signed)

## 2022-07-17 NOTE — Progress Notes (Signed)
New Patient Office Visit   Subjective   Patient ID: Danielle Sweeney, female    DOB: 1970-10-28  Age: 52 y.o. MRN: [id number]  CC:  Chief Complaint  Patient presents with   Hypertension    Follow up visit and lab review    chest pain     For a couple years but seems to be getting worse. Starts in her chest and goes up in her neck and throat and then back down and sometimes hard time catching her breath     HPI Danielle Sweeney 52 year old female, presents to the clinic for chest pain She  has a past medical history of Bipolar 1 disorder ([facility]), Depression, GERD (gastroesophageal reflux disease), H/O anorexia nervosa, and Suicide attempt ([facility]).  Chest Pain  This is a recurrent problem. Patient reports chest pain occurs intermittently and has been unchanged since onset. The pain is present in the substernal region. The pain is at a severity of 8/10. The quality of the pain is described as pressure, stabbing, burning and tightness. The pain radiates to the epigastrium and mid back. Associated symptoms include lower extremity edema and palpitations. Pertinent negatives include no dizziness, leg pain or near-syncope. The pain is aggravated by emotional upset. She has tried nothing for the symptoms. Risk factors include smoking/tobacco exposure, stress and lack of exercise.  Her past medical history is significant for anxiety/panic attacks and hyperlipidemia. Her family medical history is significant for heart disease and hypertension.      Outpatient Encounter Medications as of 07/17/2022  Medication Sig   AJOVY 225 MG/1.5ML SOSY Inject into the skin.   benztropine (COGENTIN) 1 MG tablet Take 1 tablet (1 mg total) by mouth 2 (two) times daily.   cholestyramine (QUESTRAN) 4 g packet Take 1 packet (4 g total) by mouth 2 (two) times daily.   lamoTRIgine (LAMICTAL) 100 MG tablet TAKE 2 TABLETS BY ORAL ROUTE IN THE MORNING AND 2 TABLETS AT BEDTIME   LORazepam (ATIVAN) 1 MG tablet Take 1 tablet (1  mg total) by mouth daily as needed for anxiety.   lumateperone tosylate (CAPLYTA) 42 MG capsule Take 1 tablet by mouth daily.   nitroGLYCERIN (NITROSTAT) 0.4 MG SL tablet Place 1 tablet (0.4 mg total) under the tongue every 5 (five) minutes as needed for chest pain.   omeprazole (PRILOSEC) 40 MG capsule Take 40 mg by mouth 2 (two) times daily.   ondansetron (ZOFRAN) 8 MG tablet TAKE 1 TABLET BY MOUTH 3 TIMES DAILY (EVERY 8 HOURS) AS NEEDED FOR NAUSEA OR VOMITING.   Sertraline HCl 200 MG CAPS Take 1 capsule by mouth every morning.   SUMAtriptan (IMITREX) 100 MG tablet TAKE 1 TABLET(100 MG) BY MOUTH DAILY AS NEEDED FOR MIGRAINE   traZODone (DESYREL) 100 MG tablet Take 100 mg by mouth at bedtime.   Varenicline Tartrate, Starter, 0.5 MG X 11 & 1 MG X 42 TBPK Take one 0.5 mg tab by mouth once daily for 3 days, then take one 0.5 mg tab twice daily for 4 days, then take one 1 mg tab twice daily.   XIIDRA 5 % SOLN    famotidine (PEPCID) 40 MG tablet Take 1 tablet (40 mg total) by mouth daily.   [DISCONTINUED] budesonide-formoterol (SYMBICORT) 160-4.5 MCG/ACT inhaler Inhale into the lungs. As needed   [DISCONTINUED] lurasidone (LATUDA) 40 MG TABS tablet Take 40 mg by mouth daily.   No facility-administered encounter medications on file as of 07/17/2022.    Past Surgical History:  Procedure Laterality Date   BACK SURGERY Bilateral 2015   BUNIONECTOMY     CHOLECYSTECTOMY     LIPOMA EXCISION      Review of Systems  Constitutional:  Negative for chills and fever.  Respiratory:  Negative for shortness of breath.   Cardiovascular:  Positive for chest pain and palpitations.  Gastrointestinal:  Negative for abdominal pain.  Genitourinary:  Negative for dysuria.  Skin:  Positive for rash.  Neurological:  Negative for dizziness and headaches.      Objective    BP (!) 143/83   Pulse 72   Resp 16   Ht 5\' 5"  (1.651 m)   Wt 139 lb 1.9 oz (63.1 kg)   SpO2 95%   BMI 23.15 kg/m   Physical  Exam Vitals reviewed.  Constitutional:      General: She is not in acute distress.    Appearance: Normal appearance. She is not ill-appearing, toxic-appearing or diaphoretic.  HENT:     Head: Normocephalic.  Eyes:     General:        Right eye: No discharge.        Left eye: No discharge.     Conjunctiva/sclera: Conjunctivae normal.  Cardiovascular:     Rate and Rhythm: Normal rate.     Pulses: Normal pulses.     Heart sounds: Normal heart sounds.  Pulmonary:     Effort: Pulmonary effort is normal. No respiratory distress.     Breath sounds: Normal breath sounds.  Abdominal:     Palpations: Abdomen is soft.     Tenderness: There is no left CVA tenderness.  Musculoskeletal:        General: Normal range of motion.     Cervical back: Normal range of motion.  Skin:    General: Skin is warm and dry.     Capillary Refill: Capillary refill takes less than 2 seconds.  Neurological:     General: No focal deficit present.     Mental Status: She is alert and oriented to person, place, and time.     Coordination: Coordination normal.     Gait: Gait normal.  Psychiatric:        Mood and Affect: Mood normal.        Behavior: Behavior normal.       Assessment & Plan:  Chest pain, unspecified type Assessment & Plan: Vitals:   07/17/22 0944  BP: (!) 143/83  Blood pressure not controlled in today's visit Advise to follow up in 2 weeks with at home blood pressure readings EKG ordered- awaiting results will follow up Prescribed Nitroglycerin 0.4 mg SL Advise patient to limit caffeine intake, avoid fatty foods, reduce alcohol intake, mediation to prevent anxiety attacks.    Orders: -     EKG 12-Lead  Iron deficiency anemia, unspecified iron deficiency anemia type -     Folate  Skin texture changes -     Ambulatory referral to Dermatology  Other orders -     Nitroglycerin; Place 1 tablet (0.4 mg total) under the tongue every 5 (five) minutes as needed for chest pain.   Dispense: 50 tablet; Refill: 3    Return in about 2 weeks (around 07/31/2022) for hypertension, re-check blood pressure.   Cruzita Lederer Newman Nip, FNP

## 2022-07-18 LAB — FOLATE: Folate: 4.3 ng/mL (ref >3.0)

## 2022-07-23 ENCOUNTER — Ambulatory Visit (INDEPENDENT_AMBULATORY_CARE_PROVIDER_SITE_OTHER): Payer: Medicare Other | Admitting: Gastroenterology

## 2022-07-29 ENCOUNTER — Other Ambulatory Visit: Payer: Self-pay

## 2022-07-29 ENCOUNTER — Encounter: Payer: Self-pay | Admitting: Family Medicine

## 2022-07-29 ENCOUNTER — Ambulatory Visit (INDEPENDENT_AMBULATORY_CARE_PROVIDER_SITE_OTHER): Payer: Medicare Other | Admitting: Family Medicine

## 2022-07-29 VITALS — BP 140/88 | HR 100 | Ht 65.0 in | Wt 136.0 lb

## 2022-07-29 DIAGNOSIS — D509 Iron deficiency anemia, unspecified: Secondary | ICD-10-CM | POA: Diagnosis not present

## 2022-07-29 DIAGNOSIS — E611 Iron deficiency: Secondary | ICD-10-CM | POA: Diagnosis not present

## 2022-07-29 DIAGNOSIS — R079 Chest pain, unspecified: Secondary | ICD-10-CM

## 2022-07-29 DIAGNOSIS — I10 Essential (primary) hypertension: Secondary | ICD-10-CM | POA: Diagnosis not present

## 2022-07-29 DIAGNOSIS — Z30432 Encounter for removal of intrauterine contraceptive device: Secondary | ICD-10-CM

## 2022-07-29 MED ORDER — AMLODIPINE BESYLATE 2.5 MG PO TABS
2.5000 mg | ORAL_TABLET | Freq: Every day | ORAL | 1 refills | Status: DC
Start: 2022-07-29 — End: 2022-08-20

## 2022-07-29 NOTE — Progress Notes (Signed)
Patient Office Visit   Subjective   Patient ID: Danielle Sweeney, female    DOB: Apr 20, 1970  Age: 52 y.o. MRN: [id number]  CC:  Chief Complaint  Patient presents with   Hypertension    Patient is here for HTN f/u, EKG and recheck labs.     HPI Danielle Sweeney 52 year old female, presents to the clinic for hypertension follow up. She  has a past medical history of Bipolar 1 disorder (HCC), Depression, GERD (gastroesophageal reflux disease), H/O anorexia nervosa, and Suicide attempt (HCC).  Hypertension: Patient here for follow-up of elevated blood pressure. She is not exercising and is adherent to low salt diet.  Blood pressure is not well controlled at home. Cardiac symptoms chest pain, chest pressure/discomfort, dyspnea, exertional chest pressure/discomfort, near-syncope, and palpitations.  Cardiovascular risk factors: dyslipidemia, family history of premature cardiovascular disease, hypertension, sedentary lifestyle, and smoking/ tobacco exposure.       Outpatient Encounter Medications as of 07/29/2022  Medication Sig   AJOVY 225 MG/1.5ML SOSY Inject into the skin.   amLODipine (NORVASC) 2.5 MG tablet Take 1 tablet (2.5 mg total) by mouth daily.   benztropine (COGENTIN) 1 MG tablet Take 1 tablet (1 mg total) by mouth 2 (two) times daily.   cholestyramine (QUESTRAN) 4 g packet Take 1 packet (4 g total) by mouth 2 (two) times daily.   INGREZZA 40 MG capsule Take 40 mg by mouth daily.   lamoTRIgine (LAMICTAL) 100 MG tablet TAKE 2 TABLETS BY ORAL ROUTE IN THE MORNING AND 2 TABLETS AT BEDTIME (Patient taking differently: 150 mg. TAKE 2 TABLETS BY ORAL ROUTE IN THE MORNING AND 2 TABLETS AT BEDTIME)   LORazepam (ATIVAN) 1 MG tablet Take 1 tablet (1 mg total) by mouth daily as needed for anxiety.   lumateperone tosylate (CAPLYTA) 42 MG capsule Take 1 tablet by mouth daily.   nitroGLYCERIN (NITROSTAT) 0.4 MG SL tablet Place 1 tablet (0.4 mg total) under the tongue every 5 (five) minutes as  needed for chest pain.   omeprazole (PRILOSEC) 40 MG capsule Take 40 mg by mouth 2 (two) times daily.   ondansetron (ZOFRAN) 8 MG tablet TAKE 1 TABLET BY MOUTH 3 TIMES DAILY (EVERY 8 HOURS) AS NEEDED FOR NAUSEA OR VOMITING.   Sertraline HCl 200 MG CAPS Take 1 capsule by mouth every morning.   SUMAtriptan (IMITREX) 100 MG tablet TAKE 1 TABLET(100 MG) BY MOUTH DAILY AS NEEDED FOR MIGRAINE   traZODone (DESYREL) 100 MG tablet Take 100 mg by mouth at bedtime.   Varenicline Tartrate, Starter, 0.5 MG X 11 & 1 MG X 42 TBPK Take one 0.5 mg tab by mouth once daily for 3 days, then take one 0.5 mg tab twice daily for 4 days, then take one 1 mg tab twice daily.   XIIDRA 5 % SOLN    famotidine (PEPCID) 40 MG tablet Take 1 tablet (40 mg total) by mouth daily.   No facility-administered encounter medications on file as of 07/29/2022.    Past Surgical History:  Procedure Laterality Date   BACK SURGERY Bilateral 2015   BUNIONECTOMY     CHOLECYSTECTOMY     LIPOMA EXCISION      Review of Systems  Constitutional:  Negative for chills and fever.  Respiratory:  Positive for shortness of breath.   Cardiovascular:  Positive for chest pain and palpitations. Negative for leg swelling.  Gastrointestinal:  Negative for abdominal pain and vomiting.  Genitourinary:  Negative for dysuria.  Musculoskeletal:  Negative for  myalgias.  Neurological:  Positive for headaches. Negative for dizziness.      Objective    BP (!) 140/88   Pulse 100   Ht 5\' 5"  (1.651 m)   Wt 136 lb (61.7 kg)   SpO2 96%   BMI 22.63 kg/m   Physical Exam Vitals reviewed.  Constitutional:      General: She is not in acute distress.    Appearance: Normal appearance. She is not ill-appearing, toxic-appearing or diaphoretic.  HENT:     Head: Normocephalic.  Eyes:     General:        Right eye: No discharge.        Left eye: No discharge.     Conjunctiva/sclera: Conjunctivae normal.  Cardiovascular:     Rate and Rhythm: Normal rate.      Pulses: Normal pulses.     Heart sounds: Normal heart sounds.  Pulmonary:     Effort: Pulmonary effort is normal. No respiratory distress.     Breath sounds: Normal breath sounds.  Musculoskeletal:        General: Normal range of motion.     Cervical back: Normal range of motion.  Skin:    General: Skin is warm and dry.     Capillary Refill: Capillary refill takes less than 2 seconds.  Neurological:     General: No focal deficit present.     Mental Status: She is alert and oriented to person, place, and time.     Coordination: Coordination normal.     Gait: Gait normal.  Psychiatric:        Mood and Affect: Mood normal.        Behavior: Behavior normal.       Assessment & Plan:  Chest pain, unspecified type -     Ambulatory referral to Cardiology  Primary hypertension Assessment & Plan: Vitals:   07/29/22 1046 07/29/22 1100  BP: 135/79 (!) 140/88  Patient reported at home blood pressure ranges from 140/90's Started amlodipine 2.5 mg, Follow up in 4 weeks with at home blood pressure reading logs.  Explained non pharmacological interventions such as low salt, DASH diet discussed. Educated on stress reduction and physical activity minimum 150 minutes per week. Discussed signs and symptoms of major cardiovascular event and need to present to the ED. Patient verbalizes understanding regarding plan of care and all questions answered.   Orders: -     amLODIPine Besylate; Take 1 tablet (2.5 mg total) by mouth daily.  Dispense: 30 tablet; Refill: 1  Iron deficiency -     CBC  Encounter for IUD removal -     Ambulatory referral to Obstetrics / Gynecology    Return in about 4 weeks (around 08/26/2022) for re-check blood pressure, hypertension.   Cruzita Lederer Newman Nip, FNP

## 2022-07-29 NOTE — Assessment & Plan Note (Signed)
Vitals:   07/29/22 1046 07/29/22 1100  BP: 135/79 (!) 140/88  Patient reported at home blood pressure ranges from 140/90's Started amlodipine 2.5 mg, Follow up in 4 weeks with at home blood pressure reading logs.  Explained non pharmacological interventions such as low salt, DASH diet discussed. Educated on stress reduction and physical activity minimum 150 minutes per week. Discussed signs and symptoms of major cardiovascular event and need to present to the ED. Patient verbalizes understanding regarding plan of care and all questions answered.

## 2022-07-29 NOTE — Patient Instructions (Addendum)

## 2022-07-30 LAB — CBC: Hemoglobin: 11.4 g/dL (ref 11.1–15.9)

## 2022-07-31 ENCOUNTER — Encounter: Payer: Self-pay | Admitting: Family Medicine

## 2022-07-31 LAB — IRON,TIBC AND FERRITIN PANEL
Ferritin: 162 ng/mL — ABNORMAL HIGH (ref 15–150)
Iron Saturation: 28 % (ref 15–55)
Iron: 99 ug/dL (ref 27–159)
Total Iron Binding Capacity: 352 ug/dL (ref 250–450)
UIBC: 253 ug/dL (ref 131–425)

## 2022-07-31 LAB — CBC
Hematocrit: 37.3 % (ref 34.0–46.6)
MCH: 22.5 pg — ABNORMAL LOW (ref 26.6–33.0)
MCHC: 30.6 g/dL — ABNORMAL LOW (ref 31.5–35.7)
MCV: 74 fL — ABNORMAL LOW (ref 79–97)
Platelets: 260 10*3/uL (ref 150–450)
RBC: 5.06 x10E6/uL (ref 3.77–5.28)
RDW: 16.1 % — ABNORMAL HIGH (ref 11.7–15.4)
WBC: 6.6 10*3/uL (ref 3.4–10.8)

## 2022-07-31 LAB — VITAMIN B12: Vitamin B-12: 466 pg/mL (ref 232–1245)

## 2022-08-03 ENCOUNTER — Encounter: Payer: Self-pay | Admitting: Family Medicine

## 2022-08-04 ENCOUNTER — Telehealth: Payer: Self-pay | Admitting: Family Medicine

## 2022-08-04 NOTE — Telephone Encounter (Signed)
Pt called in regard to previous my chart messages. Wants a cll back with EKG results as soon as possible

## 2022-08-05 NOTE — Telephone Encounter (Signed)
Nadeen Landau spoke to patient

## 2022-08-18 ENCOUNTER — Telehealth (INDEPENDENT_AMBULATORY_CARE_PROVIDER_SITE_OTHER): Payer: Medicare Other | Admitting: Gastroenterology

## 2022-08-18 VITALS — Ht 65.0 in | Wt 130.0 lb

## 2022-08-18 DIAGNOSIS — R140 Abdominal distension (gaseous): Secondary | ICD-10-CM | POA: Diagnosis not present

## 2022-08-18 DIAGNOSIS — R197 Diarrhea, unspecified: Secondary | ICD-10-CM | POA: Diagnosis not present

## 2022-08-18 DIAGNOSIS — K219 Gastro-esophageal reflux disease without esophagitis: Secondary | ICD-10-CM

## 2022-08-18 DIAGNOSIS — R110 Nausea: Secondary | ICD-10-CM

## 2022-08-18 NOTE — Patient Instructions (Signed)
Please call and schedule SIBO testing at [facility] We will refer you to the Pelvic floor therapy within [facility] as this will be closer than traveling to [facility] Please try questran 4g once daily x1 week and update me on how symptoms are doing We will continue with omeprazole twice daily and famotidine daily for now  Follow up 3 months  It was a pleasure to see you today. I want to create trusting relationships with patients and provide genuine, compassionate, and quality care. I truly value your feedback! please be on the lookout for a survey regarding your visit with me today. I appreciate your input about our visit and your time in completing this!    Jasslyn Finkel L. Jeanmarie Hubert, MSN, APRN, AGNP-C Adult-Gerontology Nurse Practitioner [facility] Gastroenterology at [facility]

## 2022-08-18 NOTE — Progress Notes (Addendum)
Primary Care Physician:  Rica Records, FNP  Primary GI: Levon Hedger   Patient Location: Home   Provider Location: [place] office   Reason for Visit: follow up of Diarrhea/nausea/bloating/GERD   Persons present on the virtual encounter, with roles: Danielle Sweeney L. Jeanmarie Hubert, MSN, APRN, AGNP-C, Ella Bodo, patient    Total time (minutes) spent on medical discussion:13 minutes  Virtual Visit via MyChart Video Note visit is conducted virtually and was requested by patient.   I connected with Danielle Sweeney on 08/18/22 at 11:15 AM EDT by telephone and verified that I am speaking with the correct person using two identifiers.   I discussed the limitations, risks, security and privacy concerns of performing an evaluation and management service by telephone and the availability of in person appointments. I also discussed with the patient that there may be a patient responsible charge related to this service. The patient expressed understanding and agreed to proceed.  Chief Complaint  Patient presents with   Diarrhea    Patient here today due to having issues with Diarrhea on going for the last few days. Patient says she had been placed on Cholestyramine in the past and it had helped. She says she did not resume the medication as she wanted to discuss this with the provider today.    History of Present Illness: Danielle Sweeney is a 52 y.o. female with past medical history of Bipolar 1 disorder, depression, GERD, anorexia, IBS-M   Last seen march 2024 as a new patient for diarrhea, abdominal pain, nausea and GERD.  At that time, History is somewhat difficult to obtain from the patient. Reporting she could go up to two weeks without a BM.  had GB removed recently. Previously on linzess and motegrity 2mg  daily together. Felt constipation well controlled at that time, up until she had GB removed. having mostly only diarrhea at time of visit. She tells me she had what sounds  to be ARM.   no longer taking linzess or motegrity, now having 4 BMs per day, though has been up to 7 per day since GB removal. Certain foods such as sphaghetti and grapes cause worse diarrhea. She has some abdominal discomfort around her umbilicus near one of her GB incisions. She notes that she continues to feel full despite going to the restroom, also chronic. not taking anything for diarrhea. She has had her latuda increased and started on ajovy as well recently.    GERD: taking omeprazole 40mg  BID, famotidine 40mg  in the morning as well. not really having any heartburn or acid reflux. notes abdominal pain. She has improvement of pain with PPI and H2B   Recommended to continue with omepqrzole 40mg  BID, continue pepcid 40mg  daily, Rx questran 4g BID, obtain ARM records, consider referral to [facility] for PFT depending on ARM results. SIBO breath testing.   ARM results reviewed: study: 04/22/2022-normal resting anal sphincter pressure with good squeeze. Paradoxical anal sphincter contraction with poor intrarectal pressure generation during attempted defecation consistent with dyssynergia. Failed balloon expulsion test.   Spoke with the patient on 3/27, she had just started Lat[place], continued with nausea and bloating, was referred for SIBO testing in April. Advised to continue Latvia.  Present:  Patient states she took Latvia x1 month which stopped her diarrhea. She is no longer taking this. She is having BMs for 2 days with formed stools but some discomfort with pushing stools out. She felt that Latvia caused her to be constipated. She had to manually disimpact  herself. She was having periodic diarrhea in between these episodes. On the medication she was going to the restroom maybe twice per week. She has been having diarrhea now, all weekend, twice Friday, once on Sunday and 3x yesterday. Stools are very loose. She has some abdominal discomfort as well. She notes sometimes she may not always know  when she needs to use the restroom as she has lot of grumbling in her stomach. She has had some fecal incontinence.   She did not do SIBO testing previously because she was unsure of why she was having this testing. She continues to have a lot of gas/flatulence as well as nausea, though she feels nausea has improved some. She is taking omeprazole 40mg  BID and famotidine 40mg  daily with good results. Only taking zofran as needed.   ARM: rectal dyssynergia  Celiac panel 06/2020 negative GES:11/2020 normal UGIS: 06/2020 normal PIllcam: unremarkable CT A/P 05/2020 with constiaption H pylori breath test 03/2022 negative Last Colonoscopy:03/2020, tubular adenoma   Last Endoscopy:03/2020 chronic gastirits   Recommendations:  Repeat TCS 5 years   Past Medical History:  Diagnosis Date   Bipolar 1 disorder (HCC)    Depression    GERD (gastroesophageal reflux disease)    H/O anorexia nervosa    Suicide attempt [facility])    Past Surgical History:  Procedure Laterality Date   BACK SURGERY Bilateral 2015   BUNIONECTOMY     CHOLECYSTECTOMY     LIPOMA EXCISION      Current Meds  Medication Sig   AJOVY 225 MG/1.5ML SOSY Inject into the skin. Every month.   famotidine (PEPCID) 40 MG tablet Take 1 tablet (40 mg total) by mouth daily.   INGREZZA 40 MG capsule Take 40 mg by mouth daily.   lamoTRIgine (LAMICTAL) 100 MG tablet TAKE 2 TABLETS BY ORAL ROUTE IN THE MORNING AND 2 TABLETS AT BEDTIME (Patient taking differently: Take 100 mg by mouth daily.)   LORazepam (ATIVAN) 1 MG tablet Take 1 tablet (1 mg total) by mouth daily as needed for anxiety.   omeprazole (PRILOSEC) 40 MG capsule Take 40 mg by mouth 2 (two) times daily.   Sertraline HCl 200 MG CAPS Take 1 capsule by mouth every morning.   SUMAtriptan (IMITREX) 100 MG tablet TAKE 1 TABLET(100 MG) BY MOUTH DAILY AS NEEDED FOR MIGRAINE   traZODone (DESYREL) 100 MG tablet Take 100 mg by mouth at bedtime.   Varenicline Tartrate, Starter, 0.5 MG X 11 & 1 MG  X 42 TBPK Take one 0.5 mg tab by mouth once daily for 3 days, then take one 0.5 mg tab twice daily for 4 days, then take one 1 mg tab twice daily.   XIIDRA 5 % SOLN 2 (two) times daily.     Family History  Problem Relation Age of Onset   Heart failure Mother    Sickle cell anemia Father    Asthma Brother    Heart attack Maternal Grandmother    Diabetes type II Maternal Grandmother    Lung cancer Maternal Grandfather    Breast cancer Neg Hx    Colon cancer Neg Hx     Social History   Socioeconomic History   Marital status: Legally Separated    Spouse name: Not on file   Number of children: Not on file   Years of education: Not on file   Highest education level: Some college, no degree  Occupational History   Not on file  Tobacco Use   Smoking status: Some  Days    Types: E-cigarettes    Passive exposure: Current   Smokeless tobacco: Never   Tobacco comments:    Started smoking cigarettes 5 years ago. Smokes 12 sticks a day. Currently smoking 4 cigarettes daily.  Vaping Use   Vaping Use: Never used  Substance and Sexual Activity   Alcohol use: Not Currently   Drug use: No   Sexual activity: Not Currently    Birth control/protection: I.U.D.  Other Topics Concern   Not on file  Social History Narrative   Lives with her mother, on disability for mental health and chronic back pain   Social Determinants of Health   Financial Resource Strain: Low Risk  (07/17/2022)   Overall Financial Resource Strain (CARDIA)    Difficulty of Paying Living Expenses: Not very hard  Food Insecurity: Food Insecurity Present (07/17/2022)   Hunger Vital Sign    Worried About Running Out of Food in the Last Year: Sometimes true    Ran Out of Food in the Last Year: Sometimes true  Transportation Needs: No Transportation Needs (07/17/2022)   PRAPARE - Administrator, Civil Service (Medical): No    Lack of Transportation (Non-Medical): No  Physical Activity: Unknown (07/17/2022)    Exercise Vital Sign    Days of Exercise per Week: 0 days    Minutes of Exercise per Session: Not on file  Stress: Stress Concern Present (07/17/2022)   [employer] of Occupational Health - Occupational Stress Questionnaire    Feeling of Stress : Rather much  Social Connections: Unknown (07/17/2022)   Social Connection and Isolation Panel [NHANES]    Frequency of Communication with Friends and Family: Three times a week    Frequency of Social Gatherings with Friends and Family: Three times a week    Attends Religious Services: Never    Active Member of Clubs or Organizations: No    Attends Engineer, structural: Not on file    Marital Status: Patient declined   Review of Systems: Gen: Denies fever, chills, anorexia. Denies fatigue, weakness, weight loss.  CV: Denies chest pain, palpitations, syncope, peripheral edema, and claudication. Resp: Denies dyspnea at rest, cough, wheezing, coughing up blood, and pleurisy. GI: see HPI Derm: Denies rash, itching, dry skin Psych: Denies depression, anxiety, memory loss, confusion. No homicidal or suicidal ideation.  Heme: Denies bruising, bleeding, and enlarged lymph nodes.  Observations/Objective: No distress. Unable to perform physical exam due to telephone encounter. No video available.   Assessment and Plan: Astryd Friel is a 52 y.o. female with past medical history of Bipolar 1 disorder, depression, GERD, anorexia, IBS-M   Extensive workup in the past for GI symptoms. Started on questran at last visit for diarrhea, though previously with more constipation prior to GB removal. Questran stopped her diarrhea but constipated her so she stopped it. Now having only diarrhea. Likely her rectal dyssynergia is playing a role but could still be some aspect of BAD given recent GB removal. Recommend starting questran 4g, but take once daily. She can update me in 1 week on how she is doing with this. Previous ARM testing showed rectal  dyssynergia, we will refer her for pelvic floor physical therapy and biofeedback within [facility].  did not do SIBO testing previously because she was unsure of why she was having this testing. We discussed indications for testing to include nausea, gas, bloating. Patient verbalized understanding and is agreeable to proceed with SIBO testing. She will reach out to [facility] to schedule  as they had already called her previously. Will continue with PPI BID and H2B for now. Can use zofran PRN as she is doing.  -continue with PPI BID, H2B daily -SIBO breath testing ( pt will call to setup as they had already reached out to her to schedule) -referral pelvic floor therapy/biofeedback within [facility]  -try questran 4g Daily, x1 week, patient to call with up update -continue zofran PRN   Follow Up Instructions: 3 months    I discussed the assessment and treatment plan with the patient. The patient was provided an opportunity to ask questions and all were answered. The patient agreed with the plan and demonstrated an understanding of the instructions.   The patient was advised to call back or seek an in-person evaluation if the symptoms worsen or if the condition fails to improve as anticipated.  I provided 13 minutes of face-to-face time during this MyChart Video encounter.  Jeremyah Jelley L. Jeanmarie Hubert, MSN, APRN, AGNP-C Adult-Gerontology Nurse Practitioner [facility]  I have reviewed the note and agree with the APP's assessment as described in this progress note  Katrinka Blazing, MD Gastroenterology and Hepatology [facility] Gastroenterology

## 2022-08-20 ENCOUNTER — Other Ambulatory Visit: Payer: Self-pay | Admitting: Family Medicine

## 2022-08-20 DIAGNOSIS — I10 Essential (primary) hypertension: Secondary | ICD-10-CM

## 2022-08-20 DIAGNOSIS — F410 Panic disorder [episodic paroxysmal anxiety] without agoraphobia: Secondary | ICD-10-CM | POA: Diagnosis not present

## 2022-08-20 DIAGNOSIS — F250 Schizoaffective disorder, bipolar type: Secondary | ICD-10-CM | POA: Diagnosis not present

## 2022-08-24 ENCOUNTER — Encounter: Payer: Medicare Other | Admitting: Adult Health

## 2022-08-25 ENCOUNTER — Telehealth: Payer: Self-pay | Admitting: Family Medicine

## 2022-08-25 NOTE — Telephone Encounter (Signed)
Patient called need med refill no longer get it from [place] provider.  AJOVY 225 MG/1.5ML SOSY [[id number]     Pharmacy: [facility] [place]   Any questions can send mychart message or call patient

## 2022-08-25 NOTE — Telephone Encounter (Signed)
Please inform patient to follow up with neurologist for this refill

## 2022-08-26 ENCOUNTER — Other Ambulatory Visit: Payer: Self-pay | Admitting: Family Medicine

## 2022-08-26 ENCOUNTER — Ambulatory Visit: Payer: Medicare Other | Admitting: Family Medicine

## 2022-08-26 MED ORDER — AJOVY 225 MG/1.5ML ~~LOC~~ SOSY: PREFILLED_SYRINGE | SUBCUTANEOUS | 0 refills | Status: DC

## 2022-08-26 NOTE — Telephone Encounter (Signed)
Per patient not appoint,ment til October 05, 2022 at 8:30 am. You take medication on the 3rd of every month. Please contact patient back an, [employer] needs prior authorization through her insurance and they will approve it and does not need to be done by neurologist.

## 2022-08-26 NOTE — Addendum Note (Signed)
Addended by: Rica Records on: 08/26/2022 09:27 AM   Modules accepted: Orders

## 2022-08-27 ENCOUNTER — Other Ambulatory Visit: Payer: Self-pay | Admitting: Family Medicine

## 2022-08-27 NOTE — Telephone Encounter (Signed)
Spoke with pharmacy and patient. We never received anything about doing a PA, pharmacy is resending.

## 2022-08-27 NOTE — Telephone Encounter (Signed)
Patient called still not heard back from the nurse or provider.  Patient asked for a call back from nurse or the provider about this shot Ajovy. Patient said she can not go without this shot.

## 2022-08-28 ENCOUNTER — Other Ambulatory Visit: Payer: Self-pay | Admitting: Family Medicine

## 2022-09-03 ENCOUNTER — Ambulatory Visit: Payer: Medicare Other | Admitting: Family Medicine

## 2022-09-07 ENCOUNTER — Ambulatory Visit (INDEPENDENT_AMBULATORY_CARE_PROVIDER_SITE_OTHER): Payer: Medicare Other | Admitting: Internal Medicine

## 2022-09-07 ENCOUNTER — Encounter: Payer: Self-pay | Admitting: Internal Medicine

## 2022-09-07 ENCOUNTER — Telehealth: Payer: Self-pay | Admitting: Family Medicine

## 2022-09-07 VITALS — BP 120/66 | HR 76 | Ht 65.0 in | Wt 136.8 lb

## 2022-09-07 DIAGNOSIS — J189 Pneumonia, unspecified organism: Secondary | ICD-10-CM | POA: Diagnosis not present

## 2022-09-07 MED ORDER — AZITHROMYCIN 250 MG PO TABS
ORAL_TABLET | ORAL | 0 refills | Status: DC
Start: 2022-09-07 — End: 2022-09-17

## 2022-09-07 NOTE — Telephone Encounter (Signed)
Wants to switch provider to [facility].

## 2022-09-07 NOTE — Progress Notes (Signed)
Acute Office Visit  Subjective:     Patient ID: Danielle Sweeney, female    DOB: 02-Aug-1970, 52 y.o.   MRN: [id number]  Chief Complaint  Patient presents with   URI    Started on 09/01/2022. Sore throat, green sputum, nasal congestion, facial swelling, facial pain, headaches, upper body sore.   Ms. Ingle presents today for an acute visit endorsing a 1 week history of sore throat, nasal congestion, cough with green sputum production, dizziness, and sinus pressure.  Overall her symptoms been getting worse.  She additionally endorses subjective fever/chills, but has not checked her temperature.  Ms. Hermelinda Dellen has recently traveled to [place] for graduation services.  She has attempted to manage her symptoms with DayQuil and Tylenol without sustained symptomatic relief.  Review of Systems  Constitutional:  Positive for chills and weight loss. Negative for fever.  HENT:  Positive for congestion, sinus pain and sore throat.   Respiratory:  Positive for cough and sputum production.   Cardiovascular:  Negative for chest pain.  Gastrointestinal:  Negative for diarrhea.  Genitourinary:  Negative for dysuria.  Musculoskeletal:  Negative for myalgias.  Neurological:  Positive for dizziness.      Objective:    BP 120/66   Pulse 76   Ht 5\' 5"  (1.651 m)   Wt 136 lb 12.8 oz (62.1 kg)   SpO2 97%   BMI 22.76 kg/m   Physical Exam Vitals reviewed.  Constitutional:      General: She is not in acute distress.    Appearance: Normal appearance.  HENT:     Head: Normocephalic and atraumatic.     Right Ear: Tympanic membrane and external ear normal.     Left Ear: Tympanic membrane and external ear normal.     Nose: Congestion and rhinorrhea present.     Mouth/Throat:     Mouth: Mucous membranes are moist.     Pharynx: Oropharynx is clear. No oropharyngeal exudate or posterior oropharyngeal erythema.  Eyes:     Extraocular Movements: Extraocular movements intact.     Conjunctiva/sclera:  Conjunctivae normal.     Pupils: Pupils are equal, round, and reactive to light.  Cardiovascular:     Rate and Rhythm: Normal rate and regular rhythm.     Pulses: Normal pulses.     Heart sounds: Normal heart sounds.  Pulmonary:     Effort: Pulmonary effort is normal.     Breath sounds: Rhonchi present.  Abdominal:     General: Abdomen is flat. Bowel sounds are normal. There is no distension.     Palpations: Abdomen is soft.     Tenderness: There is no abdominal tenderness.  Musculoskeletal:        General: Normal range of motion.     Cervical back: Normal range of motion.  Lymphadenopathy:     Cervical: No cervical adenopathy.  Skin:    General: Skin is warm and dry.     Capillary Refill: Capillary refill takes less than 2 seconds.  Neurological:     General: No focal deficit present.     Mental Status: She is alert and oriented to person, place, and time.  Psychiatric:        Mood and Affect: Mood normal.        Behavior: Behavior normal.        Thought Content: Thought content normal.        Judgment: Judgment normal.       Assessment & Plan:   Problem List Items  Addressed This Visit       Community acquired pneumonia - Primary    Presenting today for an acute visit endorsing a 1 week history of the symptoms endorsed above.  Notably, she has a cough productive of green sputum.  She endorses fever/chills as well, but has not checked her temperature.  Overall symptoms are worsening and have not improved despite OTC cough/cold medications. -Add azithromycin x 5 days for treatment of community-acquired pneumonia -I recommend that she continue additional supportive care measures -She was instructed return to care if her symptoms worsen or fail to improve      Meds ordered this encounter  Medications   azithromycin (ZITHROMAX Z-PAK) 250 MG tablet    Sig: Take 2 tablets (500 mg) PO today, then 1 tablet (250 mg) PO daily x4 days.    Dispense:  6 tablet    Refill:  0    Return if symptoms worsen or fail to improve.  Billie Lade, MD

## 2022-09-07 NOTE — Patient Instructions (Signed)
It was a pleasure to see you today.  Thank you for giving [place] the opportunity to be involved in your care.  Below is a brief recap of your visit and next steps.  We will plan to see you again in June 25th.  Summary Azithromycin prescribed today for treatment of infection Continue additional supportive care measure Return to care if symptoms are not improving by the end of the week

## 2022-09-08 NOTE — Telephone Encounter (Signed)
This patient requests to switch her pcp to you. Will you review her record and see if you will accept her

## 2022-09-09 ENCOUNTER — Ambulatory Visit: Payer: Medicare Other

## 2022-09-09 NOTE — Telephone Encounter (Signed)
Current PCP is required to evaluate the request to transfer. Staff is required to obtain the reason.

## 2022-09-10 ENCOUNTER — Ambulatory Visit: Payer: Medicare Other | Admitting: Family Medicine

## 2022-09-13 DIAGNOSIS — J189 Pneumonia, unspecified organism: Secondary | ICD-10-CM | POA: Insufficient documentation

## 2022-09-13 NOTE — Assessment & Plan Note (Signed)
Presenting today for an acute visit endorsing a 1 week history of the symptoms endorsed above.  Notably, she has a cough productive of green sputum.  She endorses fever/chills as well, but has not checked her temperature.  Overall symptoms are worsening and have not improved despite OTC cough/cold medications. -Add azithromycin x 5 days for treatment of community-acquired pneumonia -I recommend that she continue additional supportive care measures -She was instructed return to care if her symptoms worsen or fail to improve

## 2022-09-15 ENCOUNTER — Encounter: Payer: Self-pay | Admitting: Family Medicine

## 2022-09-15 ENCOUNTER — Other Ambulatory Visit: Payer: Self-pay | Admitting: Family Medicine

## 2022-09-15 ENCOUNTER — Ambulatory Visit (INDEPENDENT_AMBULATORY_CARE_PROVIDER_SITE_OTHER): Payer: Medicare Other | Admitting: Family Medicine

## 2022-09-15 VITALS — BP 126/75 | HR 89 | Temp 99.1°F | Ht 65.0 in | Wt 133.0 lb

## 2022-09-15 DIAGNOSIS — G43E11 Chronic migraine with aura, intractable, with status migrainosus: Secondary | ICD-10-CM | POA: Diagnosis not present

## 2022-09-15 DIAGNOSIS — E559 Vitamin D deficiency, unspecified: Secondary | ICD-10-CM | POA: Diagnosis not present

## 2022-09-15 DIAGNOSIS — I10 Essential (primary) hypertension: Secondary | ICD-10-CM

## 2022-09-15 DIAGNOSIS — Z131 Encounter for screening for diabetes mellitus: Secondary | ICD-10-CM | POA: Diagnosis not present

## 2022-09-15 DIAGNOSIS — Z13220 Encounter for screening for lipoid disorders: Secondary | ICD-10-CM

## 2022-09-15 MED ORDER — AJOVY 225 MG/1.5ML ~~LOC~~ SOAJ
SUBCUTANEOUS | 0 refills | Status: DC
Start: 2022-09-15 — End: 2022-12-29

## 2022-09-15 NOTE — Patient Instructions (Signed)

## 2022-09-15 NOTE — Telephone Encounter (Signed)
Pt does still want to switch Providers to [facility]  Is unhappy with current. Patient states that every time she comes in for an appointment she has to sit around and wait.

## 2022-09-15 NOTE — Assessment & Plan Note (Signed)
Vitals:   09/15/22 0857  BP: 126/75   Blood pressure controlled in today's visit Patient reported not taking B/P medication  Continued discussion on DASH diet, low sodium diet and maintain a exercise routine for 150 minutes per week.

## 2022-09-15 NOTE — Progress Notes (Signed)
Patient Office Visit   Subjective   Patient ID: Danielle Sweeney, female    DOB: 1971-01-14  Age: 53 y.o. MRN: [id number]  CC: No chief complaint on file.   HPI Danielle Sweeney 52 year old female, presents to the clinic for HTN follow up. She  has a past medical history of Bipolar 1 disorder (HCC), Depression, GERD (gastroesophageal reflux disease), H/O anorexia nervosa, and Suicide attempt (HCC).For the details of today's visit, please refer to assessment and plan.   HPI    Outpatient Encounter Medications as of 09/15/2022  Medication Sig   cholestyramine (QUESTRAN) 4 g packet Take 1 packet (4 g total) by mouth 2 (two) times daily.   Fremanezumab-vfrm (AJOVY) 225 MG/1.5ML SOAJ Inject into the skin. Every month. - Subcutaneous   INGREZZA 40 MG capsule Take 40 mg by mouth daily.   lamoTRIgine (LAMICTAL) 150 MG tablet Take 150 mg by mouth daily.   LORazepam (ATIVAN) 1 MG tablet Take 1 tablet (1 mg total) by mouth daily as needed for anxiety.   nitroGLYCERIN (NITROSTAT) 0.4 MG SL tablet Place 1 tablet (0.4 mg total) under the tongue every 5 (five) minutes as needed for chest pain.   omeprazole (PRILOSEC) 40 MG capsule Take 40 mg by mouth 2 (two) times daily.   Sertraline HCl 200 MG CAPS Take 1 capsule by mouth every morning.   SUMAtriptan (IMITREX) 100 MG tablet TAKE 1 TABLET(100 MG) BY MOUTH DAILY AS NEEDED FOR MIGRAINE   trazodone (DESYREL) 300 MG tablet Take 300 mg by mouth at bedtime.   Varenicline Tartrate, Starter, 0.5 MG X 11 & 1 MG X 42 TBPK Take one 0.5 mg tab by mouth once daily for 3 days, then take one 0.5 mg tab twice daily for 4 days, then take one 1 mg tab twice daily.   XIIDRA 5 % SOLN 2 (two) times daily.   azithromycin (ZITHROMAX Z-PAK) 250 MG tablet Take 2 tablets (500 mg) PO today, then 1 tablet (250 mg) PO daily x4 days. (Patient not taking: Reported on 09/15/2022)   famotidine (PEPCID) 40 MG tablet Take 1 tablet (40 mg total) by mouth daily.   No  facility-administered encounter medications on file as of 09/15/2022.    Past Surgical History:  Procedure Laterality Date   BACK SURGERY Bilateral 2015   BUNIONECTOMY     CHOLECYSTECTOMY     LIPOMA EXCISION      Review of Systems  Constitutional:  Negative for chills and fever.  Respiratory:  Negative for cough.   Cardiovascular:  Negative for palpitations.  Genitourinary:  Negative for dysuria.  Neurological:  Negative for dizziness.      Objective    BP 126/75   Pulse 89   Temp 99.1 F (37.3 C) (Oral)   Ht 5\' 5"  (1.651 m)   Wt 133 lb (60.3 kg)   SpO2 96%   BMI 22.13 kg/m   Physical Exam Vitals reviewed.  Constitutional:      General: She is not in acute distress.    Appearance: Normal appearance. She is not ill-appearing, toxic-appearing or diaphoretic.  HENT:     Head: Normocephalic.  Eyes:     General:        Right eye: No discharge.        Left eye: No discharge.     Conjunctiva/sclera: Conjunctivae normal.     Pupils: Pupils are equal, round, and reactive to light.  Cardiovascular:     Rate and Rhythm: Normal rate.  Pulses: Normal pulses.     Heart sounds: Normal heart sounds.  Pulmonary:     Effort: Pulmonary effort is normal. No respiratory distress.     Breath sounds: Normal breath sounds.  Abdominal:     General: Bowel sounds are normal.     Palpations: Abdomen is soft.     Tenderness: There is no abdominal tenderness. There is no guarding.  Musculoskeletal:     Cervical back: Normal range of motion.  Skin:    General: Skin is warm and dry.     Capillary Refill: Capillary refill takes less than 2 seconds.  Neurological:     General: No focal deficit present.     Mental Status: She is alert and oriented to person, place, and time.     Coordination: Coordination normal.     Gait: Gait normal.  Psychiatric:        Mood and Affect: Mood normal.        Behavior: Behavior normal.       Assessment & Plan:  Screening for diabetes  mellitus -     Hemoglobin A1c  Screening for lipid disorders -     Lipid panel  Vitamin D deficiency -     VITAMIN D 25 Hydroxy (Vit-D Deficiency, Fractures)  Intractable chronic migraine with aura with status migrainosus -     Ajovy; Inject into the skin. Every month. - Subcutaneous  Dispense: 1.68 mL; Refill: 0  Hypertension, unspecified type Assessment & Plan: Vitals:   09/15/22 0857  BP: 126/75   Blood pressure controlled in today's visit Patient reported not taking B/P medication  Continued discussion on DASH diet, low sodium diet and maintain a exercise routine for 150 minutes per week.      Return in about 4 months (around 01/15/2023) for hyperlipidemia, pre diabetes , routine labs.   Cruzita Lederer Newman Nip, FNP

## 2022-09-16 LAB — LIPID PANEL
Chol/HDL Ratio: 2.8 ratio (ref 0.0–4.4)
Cholesterol, Total: 196 mg/dL (ref 100–199)
HDL: 70 mg/dL (ref >39)
LDL Chol Calc (NIH): 113 mg/dL — ABNORMAL HIGH (ref 0–99)
Triglycerides: 73 mg/dL (ref 0–149)
VLDL Cholesterol Cal: 13 mg/dL (ref 5–40)

## 2022-09-16 LAB — VITAMIN D 25 HYDROXY (VIT D DEFICIENCY, FRACTURES): Vit D, 25-Hydroxy: 84.8 ng/mL (ref 30.0–100.0)

## 2022-09-16 LAB — HEMOGLOBIN A1C
Est. average glucose Bld gHb Est-mCnc: 126 mg/dL
Hgb A1c MFr Bld: 6 % — ABNORMAL HIGH (ref 4.8–5.6)

## 2022-09-16 NOTE — Telephone Encounter (Signed)
Patient is requesting to transfer her care to another provider due to length of wait time during her appointment. Please contact the patient to address her concern and maintain relationship with current PCP.

## 2022-09-17 ENCOUNTER — Encounter: Payer: Self-pay | Admitting: Adult Health

## 2022-09-17 ENCOUNTER — Ambulatory Visit (INDEPENDENT_AMBULATORY_CARE_PROVIDER_SITE_OTHER): Payer: Medicare Other | Admitting: Adult Health

## 2022-09-17 VITALS — BP 112/70 | HR 92 | Ht 65.0 in | Wt 132.0 lb

## 2022-09-17 DIAGNOSIS — R61 Generalized hyperhidrosis: Secondary | ICD-10-CM

## 2022-09-17 DIAGNOSIS — R232 Flushing: Secondary | ICD-10-CM

## 2022-09-17 DIAGNOSIS — Z975 Presence of (intrauterine) contraceptive device: Secondary | ICD-10-CM | POA: Diagnosis not present

## 2022-09-17 DIAGNOSIS — Z780 Asymptomatic menopausal state: Secondary | ICD-10-CM

## 2022-09-17 NOTE — Progress Notes (Signed)
Subjective:     Patient ID: Danielle Sweeney, female   DOB: [birth year], 52 y.o.   MRN: [id number]  HPI Danielle Sweeney is a 52 year old black female, separated, G2P2002 in to discuss having IUD removed, ?menopause has had hot flashes and sweats.  Last pap was negative 08/22/21  PCP is I Polanco NP  Review of Systems Has had hot flashes, but none since May Has had sweating No period since 2004, has had IUD(periods used to be heavy and painful, has known fibroid) IUD prior to this removed by [facility], they could not find it  How much she pees varies, probably due to fluid intake Reviewed past medical,surgical, social and family history. Reviewed medications and allergies.     Objective:   Physical Exam BP 112/70 (BP Location: Left Arm, Patient Position: Sitting, Cuff Size: Normal)   Pulse 92   Ht 5\' 5"  (1.651 m)   Wt 132 lb (59.9 kg)   BMI 21.97 kg/m  Skin warm and dry. Neck: mid line trachea, normal thyroid, good ROM, no lymphadenopathy noted. Lungs: clear to ausculation bilaterally. Cardiovascular: regular rate and rhythm.  AA is 1 Fall risk is low    09/17/2022    9:50 AM 09/15/2022    8:58 AM 09/07/2022   10:56 AM  Depression screen PHQ 2/9  Decreased Interest 0 2 0  Down, Depressed, Hopeless 0 2 0  PHQ - 2 Score 0 4 0  Altered sleeping 1 3 3   Tired, decreased energy 0 2 3  Change in appetite 3 3 3   Feeling bad or failure about yourself  0 3 0  Trouble concentrating 0 1 0  Moving slowly or fidgety/restless 0 0 0  Suicidal thoughts 0 0 0  PHQ-9 Score 4 16 9   Difficult doing work/chores  Somewhat difficult        09/17/2022    9:51 AM 09/15/2022    9:00 AM 09/07/2022   10:57 AM 07/29/2022   10:48 AM  GAD 7 : Generalized Anxiety Score  Nervous, Anxious, on Edge 1 3 0 3  Control/stop worrying 1 3 0 3  Worry too much - different things 1 3 0 3  Trouble relaxing 0 3 0 3  Restless 1 1 0 3  Easily annoyed or irritable 0 1 0 3  Afraid - awful might happen 0 0 0 0  Total GAD 7 Score 4  14 0 18  Anxiety Difficulty  Not difficult at all  Not difficult at all      Upstream - 09/17/22 0948       Pregnancy Intention Screening   Does the patient want to become pregnant in the next year? No    Does the patient's partner want to become pregnant in the next year? No    Would the patient like to discuss contraceptive options today? No      Contraception Wrap Up   Current Method IUD or IUS    End Method IUD or IUS    Contraception Counseling Provided No                Assessment:     1. IUD (intrauterine device) in place [place] was placed 10/22 in [place]  Discussed removing vs leaving in, she says will remove in Fall  2. Hot flashes None since May  3. Night sweats  4. Menopause No period since 2004, has had IUD     Plan:     Return in 3 months for IUD removal  and check FSH 1 month after removal

## 2022-09-18 NOTE — Telephone Encounter (Signed)
Patient wishes to switch providers. See her noted concern and address with patient to see if this can be rectified.

## 2022-09-18 NOTE — Telephone Encounter (Signed)
Hello, I am okay with this she can switch to Dr.Dixon

## 2022-10-01 DIAGNOSIS — F250 Schizoaffective disorder, bipolar type: Secondary | ICD-10-CM | POA: Diagnosis not present

## 2022-10-01 DIAGNOSIS — F410 Panic disorder [episodic paroxysmal anxiety] without agoraphobia: Secondary | ICD-10-CM | POA: Diagnosis not present

## 2022-10-05 ENCOUNTER — Ambulatory Visit: Payer: Medicare Other | Admitting: Neurology

## 2022-10-07 ENCOUNTER — Other Ambulatory Visit: Payer: Self-pay | Admitting: Family Medicine

## 2022-10-07 ENCOUNTER — Ambulatory Visit (INDEPENDENT_AMBULATORY_CARE_PROVIDER_SITE_OTHER): Payer: Medicare Other

## 2022-10-07 VITALS — Ht 65.0 in | Wt 128.0 lb

## 2022-10-07 DIAGNOSIS — Z0000 Encounter for general adult medical examination without abnormal findings: Secondary | ICD-10-CM

## 2022-10-07 DIAGNOSIS — Z1231 Encounter for screening mammogram for malignant neoplasm of breast: Secondary | ICD-10-CM

## 2022-10-07 DIAGNOSIS — G43909 Migraine, unspecified, not intractable, without status migrainosus: Secondary | ICD-10-CM

## 2022-10-07 NOTE — Patient Instructions (Signed)
Danielle Sweeney , Thank you for taking time to come for your Medicare Wellness Visit. I appreciate your ongoing commitment to your health goals. Please review the following plan we discussed and let me know if I can assist you in the future.   These are the goals we discussed:  Goals       Patient Stated (pt-stated)      Patients goal is to take better care of herself and to monitor different health aspects more closely.         This is a list of the screening recommended for you and due dates:  Health Maintenance  Topic Date Due   COVID-19 Vaccine (4 - 2023-24 season) 11/30/2022*   Flu Shot  10/22/2022   Pap Smear  05/31/2023   Mammogram  06/14/2023   Medicare Annual Wellness Visit  10/07/2023   DTaP/Tdap/Td vaccine (3 - Td or Tdap) 06/26/2027   Colon Cancer Screening  07/04/2030   Hepatitis C Screening  Completed   HIV Screening  Completed   Zoster (Shingles) Vaccine  Completed   HPV Vaccine  Aged Out  *Topic was postponed. The date shown is not the original due date.    Advanced directives: Advance directive discussed with you today. Even though you declined this today, please call our office should you change your mind, and we can give you the proper paperwork for you to fill out. Advance care planning is a way to make decisions about medical care that fits your values in case you are ever unable to make these decisions for yourself.  Information on Advanced Care Planning can be found at [facility] of [facility] Advance Health Care Directives Advance Health Care Directives ([email or web address]    Conditions/risks identified:  You have an order for:  []   2D Mammogram  [x]   3D Mammogram  []   Bone Density   []   Lung Cancer Screening  Please call for appointment:   [facility] at [facility] [address] [place], [place] [address] [phone]  Make sure to wear two-piece clothing.  No lotions powders or deodorants the day of the appointment Make  sure to bring picture ID and insurance card.  Bring list of medications you are currently taking including any supplements.   Schedule your [place] screening mammogram through MyChart!   Log into your MyChart account.  Go to 'Visit' (or 'Appointments' if on mobile App) --> Schedule an Appointment  Under 'Select a Reason for Visit' choose the Mammogram Screening option.  Complete the pre-visit questions and select the time and place that best fits your schedule.    Next appointment: VIRTUAL/TELEPHONE APPOINTMENT Follow up in one year for your annual wellness visit.  November 17, 2023 at 10:00 am telephone visit  Preventive Care 40-64 Years, Female Preventive care refers to lifestyle choices and visits with your health care provider that can promote health and wellness. What does preventive care include? A yearly physical exam. This is also called an annual well check. Dental exams once or twice a year. Routine eye exams. Ask your health care provider how often you should have your eyes checked. Personal lifestyle choices, including: Daily care of your teeth and gums. Regular physical activity. Eating a healthy diet. Avoiding tobacco and drug use. Limiting alcohol use. Practicing safe sex. Taking low-dose aspirin daily starting at age 68. Taking vitamin and mineral supplements as recommended by your health care provider. What happens during an annual well check? The services and screenings  done by your health care provider during your annual well check will depend on your age, overall health, lifestyle risk factors, and family history of disease. Counseling  Your health care provider may ask you questions about your: Alcohol use. Tobacco use. Drug use. Emotional well-being. Home and relationship well-being. Sexual activity. Eating habits. Work and work Astronomer. Method of birth control. Menstrual cycle. Pregnancy history. Screening  You may have the following  tests or measurements: Height, weight, and BMI. Blood pressure. Lipid and cholesterol levels. These may be checked every 5 years, or more frequently if you are over 32 years old. Skin check. Lung cancer screening. You may have this screening every year starting at age 65 if you have a 30-pack-year history of smoking and currently smoke or have quit within the past 15 years. Fecal occult blood test (FOBT) of the stool. You may have this test every year starting at age 13. Flexible sigmoidoscopy or colonoscopy. You may have a sigmoidoscopy every 5 years or a colonoscopy every 10 years starting at age 21. Hepatitis C blood test. Hepatitis B blood test. Sexually transmitted disease (STD) testing. Diabetes screening. This is done by checking your blood sugar (glucose) after you have not eaten for a while (fasting). You may have this done every 1-3 years. Mammogram. This may be done every 1-2 years. Talk to your health care provider about when you should start having regular mammograms. This may depend on whether you have a family history of breast cancer. BRCA-related cancer screening. This may be done if you have a family history of breast, ovarian, tubal, or peritoneal cancers. Pelvic exam and Pap test. This may be done every 3 years starting at age 26. Starting at age 36, this may be done every 5 years if you have a Pap test in combination with an HPV test. Bone density scan. This is done to screen for osteoporosis. You may have this scan if you are at high risk for osteoporosis. Discuss your test results, treatment options, and if necessary, the need for more tests with your health care provider. Vaccines  Your health care provider may recommend certain vaccines, such as: Influenza vaccine. This is recommended every year. Tetanus, diphtheria, and acellular pertussis (Tdap, Td) vaccine. You may need a Td booster every 10 years. Zoster vaccine. You may need this after age 12. Pneumococcal  13-valent conjugate (PCV13) vaccine. You may need this if you have certain conditions and were not previously vaccinated. Pneumococcal polysaccharide (PPSV23) vaccine. You may need one or two doses if you smoke cigarettes or if you have certain conditions. Talk to your health care provider about which screenings and vaccines you need and how often you need them. This information is not intended to replace advice given to you by your health care provider. Make sure you discuss any questions you have with your health care provider. Document Released: 04/05/2015 Document Revised: 11/27/2015 Document Reviewed: 01/08/2015 Elsevier Interactive Patient Education  2017 [employer].    Fall Prevention in the Home Falls can cause injuries. They can happen to people of all ages. There are many things you can do to make your home safe and to help prevent falls. What can I do on the outside of my home? Regularly fix the edges of walkways and driveways and fix any cracks. Remove anything that might make you trip as you walk through a door, such as a raised step or threshold. Trim any bushes or trees on the path to your home. Use bright  outdoor lighting. Clear any walking paths of anything that might make someone trip, such as rocks or tools. Regularly check to see if handrails are loose or broken. Make sure that both sides of any steps have handrails. Any raised decks and porches should have guardrails on the edges. Have any leaves, snow, or ice cleared regularly. Use sand or salt on walking paths during winter. Clean up any spills in your garage right away. This includes oil or grease spills. What can I do in the bathroom? Use night lights. Install grab bars by the toilet and in the tub and shower. Do not use towel bars as grab bars. Use non-skid mats or decals in the tub or shower. If you need to sit down in the shower, use a plastic, non-slip stool. Keep the floor dry. Clean up any water that  spills on the floor as soon as it happens. Remove soap buildup in the tub or shower regularly. Attach bath mats securely with double-sided non-slip rug tape. Do not have throw rugs and other things on the floor that can make you trip. What can I do in the bedroom? Use night lights. Make sure that you have a light by your bed that is easy to reach. Do not use any sheets or blankets that are too big for your bed. They should not hang down onto the floor. Have a firm chair that has side arms. You can use this for support while you get dressed. Do not have throw rugs and other things on the floor that can make you trip. What can I do in the kitchen? Clean up any spills right away. Avoid walking on wet floors. Keep items that you use a lot in easy-to-reach places. If you need to reach something above you, use a strong step stool that has a grab bar. Keep electrical cords out of the way. Do not use floor polish or wax that makes floors slippery. If you must use wax, use non-skid floor wax. Do not have throw rugs and other things on the floor that can make you trip. What can I do with my stairs? Do not leave any items on the stairs. Make sure that there are handrails on both sides of the stairs and use them. Fix handrails that are broken or loose. Make sure that handrails are as long as the stairways. Check any carpeting to make sure that it is firmly attached to the stairs. Fix any carpet that is loose or worn. Avoid having throw rugs at the top or bottom of the stairs. If you do have throw rugs, attach them to the floor with carpet tape. Make sure that you have a light switch at the top of the stairs and the bottom of the stairs. If you do not have them, ask someone to add them for you. What else can I do to help prevent falls? Wear shoes that: Do not have high heels. Have rubber bottoms. Are comfortable and fit you well. Are closed at the toe. Do not wear sandals. If you use a  stepladder: Make sure that it is fully opened. Do not climb a closed stepladder. Make sure that both sides of the stepladder are locked into place. Ask someone to hold it for you, if possible. Clearly mark and make sure that you can see: Any grab bars or handrails. First and last steps. Where the edge of each step is. Use tools that help you move around (mobility aids) if they are needed. These  include: Canes. Walkers. Scooters. Crutches. Turn on the lights when you go into a dark area. Replace any light bulbs as soon as they burn out. Set up your furniture so you have a clear path. Avoid moving your furniture around. If any of your floors are uneven, fix them. If there are any pets around you, be aware of where they are. Review your medicines with your doctor. Some medicines can make you feel dizzy. This can increase your chance of falling. Ask your doctor what other things that you can do to help prevent falls. This information is not intended to replace advice given to you by your health care provider. Make sure you discuss any questions you have with your health care provider. Document Released: 01/03/2009 Document Revised: 08/15/2015 Document Reviewed: 04/13/2014 Elsevier Interactive Patient Education  2017 [employer].

## 2022-10-07 NOTE — Progress Notes (Signed)
 Because this visit was a virtual/telehealth visit,  certain criteria was not obtained, such a blood pressure, CBG if patient is a diabetic, and timed up and go. Any medications not marked as "taking" was not mentioned during the medication reconciliation part of the visit. Any vitals not documented were not able to be obtained due to this being a telehealth visit. Vitals documented are verbally provided by the patient.   Subjective:   Danielle Sweeney is a 52 y.o. female who presents for Medicare Annual (Subsequent) preventive examination.  Visit Complete: Virtual  I connected with  Danielle Sweeney on 10/07/22 by a audio enabled telemedicine application and verified that I am speaking with the correct person using two identifiers.  Patient Location: Home  Provider Location: Home Office  I discussed the limitations of evaluation and management by telemedicine. The patient expressed understanding and agreed to proceed.  Patient Medicare AWV questionnaire was completed by the patient on n/a; I have confirmed that all information answered by patient is correct and no changes since this date.  Review of Systems     Cardiac Risk Factors include: dyslipidemia;hypertension;smoking/ tobacco exposure;sedentary lifestyle     Objective:    Today's Vitals   10/07/22 1105 10/07/22 1112  Weight: 128 lb (58.1 kg)   Height: 5\' 5"  (1.651 m)   PainSc:  7    Body mass index is 21.3 kg/m.     10/07/2022   11:05 AM 05/14/2021   10:14 AM 04/22/2021    8:15 AM 11/21/2015    2:22 PM 11/21/2015   11:00 AM 11/21/2015   12:58 AM  Advanced Directives  Does Patient Have a Medical Advance Directive? No No No No No   Would patient like information on creating a medical advance directive? No - Patient declined No - Patient declined No - Patient declined No - patient declined information No - patient declined information      Information is confidential and restricted. Go to Review Flowsheets to unlock data.     Current Medications (verified) Outpatient Encounter Medications as of 10/07/2022  Medication Sig   b complex vitamins capsule Take 1 capsule by mouth daily.   cholecalciferol (VITAMIN D3) 25 MCG (1000 UNIT) tablet Take 2,500 Units by mouth daily.   famotidine (PEPCID) 40 MG tablet Take 1 tablet (40 mg total) by mouth daily.   Fremanezumab-vfrm (AJOVY) 225 MG/1.5ML SOAJ Inject into the skin. Every month. - Subcutaneous   INGREZZA 40 MG capsule Take 40 mg by mouth daily.   lamoTRIgine (LAMICTAL) 150 MG tablet Take 150 mg by mouth daily.   levonorgestrel (LILETTA, 52 MG,) 20.1 MCG/DAY IUD IUD 1 each by Intrauterine route once.   LORazepam (ATIVAN) 1 MG tablet Take 1 tablet (1 mg total) by mouth daily as needed for anxiety.   omeprazole (PRILOSEC) 40 MG capsule Take 40 mg by mouth 2 (two) times daily.   ondansetron (ZOFRAN) 4 MG tablet Take 4 mg by mouth as directed.   Sertraline HCl 200 MG CAPS Take 1 capsule by mouth every morning.   SUMAtriptan (IMITREX) 100 MG tablet TAKE 1 TABLET(100 MG) BY MOUTH DAILY AS NEEDED FOR MIGRAINE   trazodone (DESYREL) 300 MG tablet Take 300 mg by mouth at bedtime.   Varenicline Tartrate, Starter, 0.5 MG X 11 & 1 MG X 42 TBPK Take one 0.5 mg tab by mouth once daily for 3 days, then take one 0.5 mg tab twice daily for 4 days, then take one 1 mg tab twice daily.  VRAYLAR 1.5 MG capsule Take 1.5 mg by mouth daily.   cholestyramine (QUESTRAN) 4 g packet Take 1 packet (4 g total) by mouth 2 (two) times daily. (Patient not taking: Reported on 09/17/2022)   nitroGLYCERIN (NITROSTAT) 0.4 MG SL tablet Place 1 tablet (0.4 mg total) under the tongue every 5 (five) minutes as needed for chest pain. (Patient not taking: Reported on 09/17/2022)   XIIDRA 5 % SOLN 2 (two) times daily. (Patient not taking: Reported on 09/17/2022)   No facility-administered encounter medications on file as of 10/07/2022.    Allergies (verified) Codeine and Percocet [oxycodone-acetaminophen]    History: Past Medical History:  Diagnosis Date   Bipolar 1 disorder (HCC)    Depression    GERD (gastroesophageal reflux disease)    H/O anorexia nervosa    Suicide attempt [facility])    Past Surgical History:  Procedure Laterality Date   BACK SURGERY Bilateral 2015   BUNIONECTOMY     CHOLECYSTECTOMY     LIPOMA EXCISION     Family History  Problem Relation Age of Onset   Heart failure Mother    Sickle cell anemia Father    Asthma Brother    Heart attack Maternal Grandmother    Diabetes type II Maternal Grandmother    Lung cancer Maternal Grandfather    Breast cancer Neg Hx    Colon cancer Neg Hx    Social History   Socioeconomic History   Marital status: Legally Separated    Spouse name: Not on file   Number of children: Not on file   Years of education: Not on file   Highest education level: Some college, no degree  Occupational History   Not on file  Tobacco Use   Smoking status: Some Days    Types: E-cigarettes    Passive exposure: Current   Smokeless tobacco: Never   Tobacco comments:    Started smoking cigarettes 5 years ago. Smokes 12 sticks a day. Currently smoking 4 cigarettes daily.  Vaping Use   Vaping status: Never Used  Substance and Sexual Activity   Alcohol use: Not Currently   Drug use: No   Sexual activity: Not Currently    Birth control/protection: I.U.D.  Other Topics Concern   Not on file  Social History Narrative   Lives with her mother, on disability for mental health and chronic back pain   Social Determinants of Health   Financial Resource Strain: Low Risk  (10/07/2022)   Overall Financial Resource Strain (CARDIA)    Difficulty of Paying Living Expenses: Not hard at all  Food Insecurity: No Food Insecurity (10/07/2022)   Hunger Vital Sign    Worried About Running Out of Food in the Last Year: Never true    Ran Out of Food in the Last Year: Never true  Recent Concern: Food Insecurity - Food Insecurity Present (09/17/2022)   Hunger  Vital Sign    Worried About Running Out of Food in the Last Year: Sometimes true    Ran Out of Food in the Last Year: Sometimes true  Transportation Needs: No Transportation Needs (10/07/2022)   PRAPARE - Administrator, Civil Service (Medical): No    Lack of Transportation (Non-Medical): No  Physical Activity: Insufficiently Active (10/07/2022)   Exercise Vital Sign    Days of Exercise per Week: 2 days    Minutes of Exercise per Session: 10 min  Stress: Stress Concern Present (10/07/2022)   [employer] of Occupational Health - Occupational Stress Questionnaire  Feeling of Stress : Rather much  Social Connections: Moderately Integrated (10/07/2022)   Social Connection and Isolation Panel [NHANES]    Frequency of Communication with Friends and Family: More than three times a week    Frequency of Social Gatherings with Friends and Family: Twice a week    Attends Religious Services: More than 4 times per year    Active Member of [employer] or Organizations: Yes    Attends Engineer, structural: More than 4 times per year    Marital Status: Divorced  Recent Concern: Social Connections - Moderately Isolated (09/17/2022)   Social Connection and Isolation Panel [NHANES]    Frequency of Communication with Friends and Family: More than three times a week    Frequency of Social Gatherings with Friends and Family: Twice a week    Attends Religious Services: More than 4 times per year    Active Member of [employer] or Organizations: No    Attends Banker Meetings: Never    Marital Status: Divorced    Tobacco Counseling Ready to quit: Yes Counseling given: Yes Tobacco comments: Started smoking cigarettes 5 years ago. Smokes 12 sticks a day. Currently smoking 4 cigarettes daily.   Clinical Intake:  Pre-visit preparation completed: Yes  Pain : 0-10 Pain Score: 7  Pain Type: Chronic pain Pain Location: Back Pain Orientation: Lower Pain Descriptors / Indicators:  Constant Pain Onset: More than a month ago Pain Frequency: Constant     Nutritional Status: BMI of 19-24  Normal Nutritional Risks: None Diabetes: No  How often do you need to have someone help you when you read instructions, pamphlets, or other written materials from your doctor or pharmacy?: 1 - Never  Interpreter Needed?: No  Information entered by ::  Danielle Sweeney, CMA   Activities of Daily Living    10/07/2022   11:17 AM  In your present state of health, do you have any difficulty performing the following activities:  Hearing? 0  Vision? 0  Difficulty concentrating or making decisions? 0  Walking or climbing stairs? 1  Comment chronic back pain hx of 2 back surgeries uses cane  Dressing or bathing? 0  Doing errands, shopping? 0  Preparing Food and eating ? N  Using the Toilet? N  In the past six months, have you accidently leaked urine? N  Do you have problems with loss of bowel control? N  Managing your Medications? N  Managing your Finances? N  Housekeeping or managing your Housekeeping? N    Patient Care Team: Del Newman Nip, Tenna Child, FNP as PCP - General (Family Medicine)  Indicate any recent Medical Services you may have received from other than [facility] providers in the past year (date may be approximate).     Assessment:   This is a routine wellness examination for Danielle Sweeney.  Hearing/Vision screen Hearing Screening - Comments:: Patient denies any hearing difficulties.    Dietary issues and exercise activities discussed:     Goals Addressed               This Visit's Progress     Patient Stated (pt-stated)        Patients goal is to take better care of herself and to monitor different health aspects more closely.        Depression Screen    10/07/2022   11:17 AM 09/17/2022    9:50 AM 09/15/2022    8:58 AM 09/07/2022   10:56 AM 07/29/2022   10:48 AM 07/17/2022  9:47 AM 06/09/2022    9:32 AM  PHQ 2/9 Scores  PHQ - 2 Score 0 0 4 0 0 4 2   PHQ- 9 Score 2 4 16 9 15 18 11     Fall Risk    10/07/2022   11:16 AM 09/17/2022    9:49 AM 09/07/2022   10:56 AM 07/29/2022   10:48 AM 07/17/2022    9:47 AM  Fall Risk   Falls in the past year? 0 0 0 0 0  Number falls in past yr: 0 0 0 0 0  Injury with Fall? 0 0 0 0 0  Risk for fall due to : No Fall Risks No Fall Risks No Fall Risks No Fall Risks   Follow up Falls prevention discussed Falls evaluation completed Falls evaluation completed Falls evaluation completed     MEDICARE RISK AT HOME:  Medicare Risk at Home - 10/07/22 1115     Any stairs in or around the home? No    If so, are there any without handrails? No    Home free of loose throw rugs in walkways, pet beds, electrical cords, etc? Yes    Adequate lighting in your home to reduce risk of falls? Yes    Life alert? No    Use of a cane, walker or w/c? Yes    Grab bars in the bathroom? Yes    Shower chair or bench in shower? No    Elevated toilet seat or a handicapped toilet? No             TIMED UP AND GO:  Was the test performed?  No    Cognitive Function:        10/07/2022   11:15 AM  6CIT Screen  What Year? 0 points  What month? 0 points  What time? 0 points  Count back from 20 0 points  Months in reverse 0 points  Repeat phrase 0 points  Total Score 0 points    Immunizations Immunization History  Administered Date(s) Administered   Hepatitis B, ADULT 06/25/2017, 01/06/2018   Influenza Inj Mdck Quad Pf 11/26/2020, 12/01/2021   Influenza Inj Mdck Quad With Preservative 01/06/2018   Influenza,inj,Quad PF,6+ Mos 01/06/2018, 01/28/2020   Influenza,inj,Quad PF,6-35 Mos 01/06/2018, 11/26/2020   Influenza-Unspecified 01/06/2018, 02/05/2020, 11/26/2020, 12/05/2021   Moderna Covid-19 Vaccine Bivalent Booster 68yrs & up 11/26/2020   Moderna Sars-Covid-2 Vaccination 10/19/2019, 11/21/2019   Tdap 11/21/2015, 06/25/2017   Zoster Recombinant(Shingrix) 01/28/2020, 12/01/2021    TDAP status: Up to  date  Flu Vaccine status: Up to date  Pneumococcal vaccine status: NOT AGE APPROPRIATE FOR THIS PATIENT  Covid-19 vaccine status: Information provided on how to obtain vaccines.   Qualifies for Shingles Vaccine? Yes   Zostavax completed Yes   Shingrix Completed?: Yes  Screening Tests Health Maintenance  Topic Date Due   Medicare Annual Wellness (AWV)  Never done   COVID-19 Vaccine (4 - 2023-24 season) 11/30/2022 (Originally 11/21/2021)   INFLUENZA VACCINE  10/22/2022   PAP SMEAR-Modifier  05/31/2023   MAMMOGRAM  06/14/2023   DTaP/Tdap/Td (3 - Td or Tdap) 06/26/2027   Colonoscopy  07/04/2030   Hepatitis C Screening  Completed   HIV Screening  Completed   Zoster Vaccines- Shingrix  Completed   HPV VACCINES  Aged Out    Health Maintenance  Health Maintenance Due  Topic Date Due   Medicare Annual Wellness (AWV)  Never done    Colorectal cancer screening: Type of screening: Colonoscopy. Completed 07/03/2020. Repeat  every 10 years  Mammogram status: Ordered 10/07/2022. Pt provided with contact info and advised to call to schedule appt.   Bone Density Screening: NOT AGE APPROPRIATE FOR THIS PATIENT  Lung Cancer Screening: (Low Dose CT Chest recommended if Age 68-80 years, 20 pack-year currently smoking OR have quit w/in 15years.) does not qualify.    Additional Screening:  Hepatitis C Screening: does not qualify; Completed 06/09/2022  Vision Screening: Recommended annual ophthalmology exams for early detection of glaucoma and other disorders of the eye. Is the patient up to date with their annual eye exam?  Yes  Who is the provider or what is the name of the office in which the patient attends annual eye exams? [facility] If pt is not established with a provider, would they like to be referred to a provider to establish care? No .   Dental Screening: Recommended annual dental exams for proper oral hygiene  Diabetic Foot Exam: n/a  Community Resource Referral  / Chronic Care Management: CRR required this visit?  No   CCM required this visit?  No     Plan:     I have personally reviewed and noted the following in the patient's chart:   Medical and social history Use of alcohol, tobacco or illicit drugs  Current medications and supplements including opioid prescriptions. Patient is not currently taking opioid prescriptions. Functional ability and status Nutritional status Physical activity Advanced directives List of other physicians Hospitalizations, surgeries, and ER visits in previous 12 months Vitals Screenings to include cognitive, depression, and falls Referrals and appointments  In addition, I have reviewed and discussed with patient certain preventive protocols, quality metrics, and best practice recommendations. A written personalized care plan for preventive services as well as general preventive health recommendations were provided to patient.     Jordan Hawks Kelee Cunningham, CMA   10/07/2022   After Visit Summary: (MyChart) Due to this being a telephonic visit, the after visit summary with patients personalized plan was offered to patient via MyChart   Nurse Notes: Mammogram ordered today

## 2022-10-08 MED ORDER — SUMATRIPTAN SUCCINATE 100 MG PO TABS
ORAL_TABLET | ORAL | 0 refills | Status: DC
Start: 2022-10-08 — End: 2022-12-30

## 2022-10-08 MED ORDER — OMEPRAZOLE 40 MG PO CPDR: 40.0000 mg | DELAYED_RELEASE_CAPSULE | Freq: Two times a day (BID) | ORAL | 2 refills | Status: DC

## 2022-10-08 MED ORDER — ONDANSETRON HCL 4 MG PO TABS: 4.0000 mg | ORAL_TABLET | ORAL | 1 refills | Status: DC

## 2022-10-09 ENCOUNTER — Ambulatory Visit: Payer: Medicare Other | Admitting: Physical Therapy

## 2022-10-28 ENCOUNTER — Ambulatory Visit (HOSPITAL_COMMUNITY): Payer: Medicare Other

## 2022-11-05 ENCOUNTER — Ambulatory Visit (HOSPITAL_COMMUNITY)
Admission: RE | Admit: 2022-11-05 | Discharge: 2022-11-05 | Disposition: A | Discharge status: Home / Self Care | Payer: Medicare Other | Source: Ambulatory Visit | Attending: Family Medicine | Admitting: Family Medicine

## 2022-11-05 ENCOUNTER — Encounter (HOSPITAL_COMMUNITY): Payer: Self-pay

## 2022-11-05 DIAGNOSIS — Z1231 Encounter for screening mammogram for malignant neoplasm of breast: Secondary | ICD-10-CM | POA: Diagnosis not present

## 2022-11-06 DIAGNOSIS — R634 Abnormal weight loss: Secondary | ICD-10-CM | POA: Diagnosis not present

## 2022-11-06 DIAGNOSIS — R110 Nausea: Secondary | ICD-10-CM | POA: Diagnosis not present

## 2022-11-06 DIAGNOSIS — R140 Abdominal distension (gaseous): Secondary | ICD-10-CM | POA: Diagnosis not present

## 2022-11-06 DIAGNOSIS — R142 Eructation: Secondary | ICD-10-CM | POA: Diagnosis not present

## 2022-11-06 DIAGNOSIS — R109 Unspecified abdominal pain: Secondary | ICD-10-CM | POA: Diagnosis not present

## 2022-11-11 ENCOUNTER — Ambulatory Visit: Payer: Medicare Other | Attending: Gastroenterology

## 2022-11-11 ENCOUNTER — Other Ambulatory Visit: Payer: Self-pay

## 2022-11-11 DIAGNOSIS — R1030 Lower abdominal pain, unspecified: Secondary | ICD-10-CM

## 2022-11-11 DIAGNOSIS — M62838 Other muscle spasm: Secondary | ICD-10-CM | POA: Diagnosis not present

## 2022-11-11 DIAGNOSIS — K5900 Constipation, unspecified: Secondary | ICD-10-CM

## 2022-11-11 DIAGNOSIS — R279 Unspecified lack of coordination: Secondary | ICD-10-CM | POA: Diagnosis not present

## 2022-11-11 DIAGNOSIS — R3915 Urgency of urination: Secondary | ICD-10-CM

## 2022-11-11 DIAGNOSIS — R293 Abnormal posture: Secondary | ICD-10-CM | POA: Diagnosis not present

## 2022-11-11 DIAGNOSIS — M5459 Other low back pain: Secondary | ICD-10-CM

## 2022-11-11 DIAGNOSIS — M6281 Muscle weakness (generalized): Secondary | ICD-10-CM

## 2022-11-11 NOTE — Therapy (Addendum)
OUTPATIENT PHYSICAL THERAPY FEMALE PELVIC EVALUATION   Patient Name: Danielle Sweeney MRN: [id number] DOB:1971-03-22, 52 y.o., female Today's Date: 11/11/2022  END OF SESSION:  PT End of Session - 11/11/22 1356     Visit Number 1    Date for PT Re-Evaluation 01/06/23    Authorization Type [employer] Medicare    PT Start Time 1400    PT Stop Time 1440    PT Time Calculation (min) 40 min    Activity Tolerance Patient tolerated treatment well    Behavior During Therapy WFL for tasks assessed/performed             Past Medical History:  Diagnosis Date   Bipolar 1 disorder (HCC)    Depression    GERD (gastroesophageal reflux disease)    H/O anorexia nervosa    Suicide attempt (HCC)    Past Surgical History:  Procedure Laterality Date   BACK SURGERY Bilateral 2015   BREAST BIOPSY  1989   benign   BUNIONECTOMY     CHOLECYSTECTOMY     LIPOMA EXCISION     Patient Active Problem List   Diagnosis Date Noted   IUD (intrauterine device) in place 09/17/2022   Night sweats 09/17/2022   Hot flashes 09/17/2022   Menopause 09/17/2022   Community acquired pneumonia 09/13/2022   Chest pain 07/17/2022   Diarrhea 06/17/2022   Abdominal bloating 06/17/2022   Nausea without vomiting 06/17/2022   Anxiety 06/09/2022   Status post lumbar spine surgery for decompression of spinal cord 01/07/2022   Chronic midline low back pain with left-sided sciatica 11/12/2021   Schizoaffective schizophrenia (HCC) 08/22/2021   Uterine leiomyoma 08/22/2021   Lumbar hernia 08/22/2021   Abnormal uterine bleeding unrelated to menstrual cycle 07/14/2021   Cervical polyp 07/14/2021   Vitamin D deficiency 04/04/2021   GERD (gastroesophageal reflux disease) 04/04/2021   Constipation 04/04/2021   Sciatica 04/04/2021   Migraine 04/04/2021   Schizophrenia (HCC) 04/04/2021   Bipolar 2 disorder (HCC) 04/04/2021   Abnormal chest CT 04/04/2021   Microcytosis 04/04/2021   Hyperlipemia 04/04/2021   Hypertension  04/04/2021   Urinary frequency 04/04/2021   Unintentional weight loss 04/04/2021   Chronic idiopathic constipation 02/26/2020   Health education 02/26/2020   Postlaminectomy syndrome, lumbar region 09/16/2018   Bipolar disorder, in full remission, most recent episode depressed (HCC) 10/22/2016   Pure hypercholesterolemia 10/22/2016   Severe single current episode of major depressive disorder, with psychotic features (HCC)    MDD (major depressive disorder) 11/22/2015   Anemia 06/08/1982    PCP: Rica Records, FNP  REFERRING PROVIDER: Raquel James, NP  REFERRING DIAG: K59.02 (ICD-10-CM) - Dyssynergic constipation  THERAPY DIAG:  Constipation, unspecified constipation type  Urinary urgency  Other low back pain  Lower abdominal pain  Muscle weakness (generalized)  Other muscle spasm  Unspecified lack of coordination  Abnormal posture  Rationale for Evaluation and Treatment: Rehabilitation  ONSET DATE: 3 years ago  SUBJECTIVE:  SUBJECTIVE STATEMENT: Pt states that she started having bowel issues 2 years ago and lost a lot of weight. She states that she has had difficulties with constipation for her entire life. Then 3 years ago constipation got worse. She would have severe cramping/bloating/gas/stinging sensation in abdomen. She does try and eat a good diet and drink a lot of water. She feels like she does not get normal signals to have bowel movement. She drank smooth move tea yesterday and had bowel movement last night and this morning. She has used enemas and used to use them every 3 days. Currently she is using 1x/week. She is trying to eat a low of fiber: oatmeal, granola, peaches, corn, green beans. Medical team is currently working her up for abdominal pain. She states that  she had gallbladder removed, but she doesn't believe that this was actually done.   Fluid intake: Yes: 16oz of water a day    PAIN:  Are you having pain? Yes NPRS scale: 7/10 currently, 10/10 at worst Pain location:  pelvic/abdominal pain; low back pain  Pain type: cramping, stinging, bloating Pain description: intermittent and constant   Aggravating factors: certain foods; pressing bowel movement out Relieving factors: omeprazole, not eating   PRECAUTIONS: None  RED FLAGS: None   WEIGHT BEARING RESTRICTIONS: No  FALLS:  Has patient fallen in last 6 months? No  LIVING ENVIRONMENT: Lives with: lives alone Lives in: House/apartment   OCCUPATION: CNA  PLOF: Independent  PATIENT GOALS:   PERTINENT HISTORY:  Bipolar, depression, GERD, anorexia, IBS-M, cholecystectomy, back surgery x2 laminectomies    BOWEL MOVEMENT: Pain with bowel movement: Yes Type of bowel movement:Type (Bristol Stool Scale) type 4, sometimes 7, Frequency 1x/week, Strain Yes, and Splinting yes, has dug stool out, will push on stomach Fully empty rectum: No Leakage: Yes: occasional  Pads: No Fiber supplement: No - doesn't think she has ever been on one  URINATION: Pain with urination: Yes  Fully empty bladder: No Stream:  strains to urinate and empty better Urgency: Yes: it is her signal that she needs to have a bowel movement Frequency: up to every 15 minutes; 1x/night Leakage:  none Pads: No  INTERCOURSE: Pain with intercourse: During Penetration Ability to have vaginal penetration:  NA Climax: WNL   PREGNANCY: Vaginal deliveries 2 Tearing Yes: grade 2 C-section deliveries 0 Currently pregnant No  PROLAPSE: She does report sensation of vaginal bulging   OBJECTIVE:  11/11/22: DIAGNOSTIC: Anal manometry performed with paradoxical anal sphincter contraction and poor intrarectal pressure generation consistent with dyssynergia - could not expel balloon  COGNITION: Overall  cognitive status: Within functional limits for tasks assessed     SENSATION: Light touch: Appears intact Proprioception: Appears intact  A/ROM: Lumbar: decreased by 75% in all directions except extension which is only 10%  FUNCTIONAL TESTS:  Breath holding with transitions Muscle guarding Use of pillow for low back when seated  GAIT: Comments: posterior pelvic tilt, increased trunk flexion, rounded shoulders/forward head, Rt lean  POSTURE: rounded shoulders, forward head, decreased lumbar lordosis, increased thoracic kyphosis, and posterior pelvic tilt   PALPATION:   General  deferred                External Perineal Exam deferred                             Internal Pelvic Floor deferred  Patient confirms identification and approves PT to assess internal pelvic floor and treatment  No  PELVIC MMT: deferred   MMT eval  Vaginal   Internal Anal Sphincter   External Anal Sphincter   Puborectalis   Diastasis Recti   (Blank rows = not tested)        TONE: deferred  PROLAPSE: deferred  TODAY'S TREATMENT:                                                                                                                              DATE:  11/11/22  EVAL  Therapeutic activities: Squatty potty Relaxed toilet mechanics Bowel massage  Increasing water/fiber intake    PATIENT EDUCATION:  Education details: See above Person educated: Patient Education method: Programmer, multimedia, Facilities manager, Actor cues, Verbal cues, and Handouts Education comprehension: verbalized understanding  HOME EXERCISE PROGRAM: Written handout   ASSESSMENT:  CLINICAL IMPRESSION: Patient is a 52 y.o. female who was seen today for physical therapy evaluation and treatment for chronic constipation, abdominal/low back pain, and urinary urgency. Exam findings limited this session due to extensive subjective history, but are notable for abnormal posture, muscle guarding, breath holding with  transitions, and limited lumbopelvic A/ROM.  Signs and symptoms are most consistent with pelvic floor muscle spasm and paradoxical pelvic floor contraction with evacuation; believe her condition also has confounding factors of chronic enema use, multiple low back and abdominal surgeries, muscle guarding behavior, and poor bowel/bladder habits. Initial treatment consisted of improved toilet mechanics, pressure management with bowel movements, bowel massage, and increasing water/fiber intake. She will continue to benefit from skilled PT intervention in order to increase frequency/ease of bowel movements, decrease pain, improve urinary urgency, begin/progress functional mobility/strengthening program, and improve quality of life.   OBJECTIVE IMPAIRMENTS: decreased activity tolerance, decreased coordination, decreased endurance, decreased strength, increased fascial restrictions, increased muscle spasms, impaired tone, postural dysfunction, and pain.   ACTIVITY LIMITATIONS:  bowel movements   PARTICIPATION LIMITATIONS: community activity and household chores   PERSONAL FACTORS: 1 comorbidity: medical history  are also affecting patient's functional outcome.   REHAB POTENTIAL: Good  CLINICAL DECISION MAKING: Stable/uncomplicated  EVALUATION COMPLEXITY: Low   GOALS: Goals reviewed with patient? Yes  SHORT TERM GOALS: Target date: 12/09/22  Pt will be independent with HEP.   Baseline: Goal status: INITIAL  2.  Pt will be independent with use of squatty potty, relaxed toileting mechanics, and improved bowel movement techniques in order to increase ease of bowel movements and complete evacuation.   Baseline:  Goal status: INITIAL  3.  Pt will be independent with diaphragmatic breathing and down training activities in order to improve pelvic floor relaxation. Baseline:  Goal status: INITIAL  4.  Pt will be able to correctly perform diaphragmatic breathing and appropriate pressure management  in order to prevent worsening vaginal wall laxity and improve pelvic floor A/ROM.   Baseline:  Goal status: INITIAL  5.  Pt will be independent with urge drill and double voiding in order to help decrease urinary urgency and complete bladder emptying.  Baseline:  Goal status: INITIAL   LONG TERM GOALS: Target date: 01/06/23  Pt will be independent with advanced HEP.   Baseline:  Goal status: INITIAL  2.  Pt will be able to appropriately perform bearing down to effectively evacuate rectum with exhale.  Baseline:  Goal status: INITIAL  3.  Pt will report increased frequency of bowel movement to at least 3x/week.  Baseline:  Goal status: INITIAL  4.  Pt will report no abdominal/pelvic/low back pain higher than 4/10 with any activity.  Baseline:  Goal status: INITIAL  5.  Pt will demonstrate normal pelvic floor muscle tone and A/ROM, able to achieve 4/5 strength with contractions and 10 sec endurance, in order to provide appropriate lumbopelvic support in functional activities.   Baseline:  Goal status: INITIAL  6.  Pt will be able to go 2-3 hours in between voids without urgency or incontinence in order to improve QOL and perform all functional activities with less difficulty.   Baseline:  Goal status: INITIAL  PLAN:  PT FREQUENCY: 1-2x/week  PT DURATION: 8 weeks  PLANNED INTERVENTIONS: Therapeutic exercises, Therapeutic activity, Neuromuscular re-education, Balance training, Gait training, Patient/Family education, Self Care, Joint mobilization, Dry Needling, Biofeedback, and Manual therapy  PLAN FOR NEXT SESSION: Plan to perform internal vaginal/rectal pelvic floor muscle evaluation and begin working on evacuation training.    Julio Alm, PT, DPT08/21/243:53 PM  PHYSICAL THERAPY DISCHARGE SUMMARY  Visits from Start of Care: 1  Current functional level related to goals / functional outcomes: Not met   Remaining deficits: See above   Education /  Equipment: HEP   Patient agrees to discharge. Patient goals were not met. Patient is being discharged due to the patient's request. Julio Alm, PT, DPT10/30/2410:18 AM

## 2022-11-11 NOTE — Patient Instructions (Addendum)
Squatty potty: When your knees are level or below the level of your hips, pelvic floor muscles are pressed against rectum, preventing ease of bowel movement. By getting knees above the level of the hips, these pelvic floor muscles relax, allowing easier passage of bowel movement. ? Ways to get knees above hips: o Squatty Potty (7inch and 9inch versions) o Small stool o Roll of toilet paper under each foot o Hardback book or stack of magazines under each foot  Relaxed Toileting mechanics: Once in this position, make sure to lean BACK with forearms on thighs, wide knees, relaxed stomach, and breathe.   Balloon Breathing: exhale forcefully through open mouth when feeling like you have to press bowel movement out   Bowel massage: To assist with more regular and more comfortable bowel movements, try performing bowel massage nightly for 5-10 minutes. Place hands in the lower right side of your abdomen to start; in small circles, massage up, across, and down the left side of your abdomen. Pressure does not need to be hard, but just comfortable. You can use lotion or oil to make more comfortable.     Water: work on increasing to [employer] of water a day   Fiber: Look into a fiber supplement - psyllium husk - discuss benefit with MD    [facility] [address] [place], [place] [address] Phone # [phone] Fax [phone]

## 2022-11-18 ENCOUNTER — Telehealth: Payer: Self-pay | Admitting: Gastroenterology

## 2022-11-18 NOTE — Telephone Encounter (Signed)
Patient made aware of sibo breath test normal results.

## 2022-11-18 NOTE — Telephone Encounter (Signed)
Hi Crystal,  Can you please call the patient and tell the patient the SIBO breath test was normal?  Thanks,  Katrinka Blazing, MD Gastroenterology and Hepatology [facility]

## 2022-11-19 ENCOUNTER — Encounter: Payer: Self-pay | Admitting: Family Medicine

## 2022-11-19 NOTE — Telephone Encounter (Signed)
Please refer this to Dr. Durwin Nora, patient requested switch to another provider months ago and seen Dr. Durwin Nora last

## 2022-11-20 ENCOUNTER — Other Ambulatory Visit: Payer: Self-pay | Admitting: Family Medicine

## 2022-11-20 DIAGNOSIS — F323 Major depressive disorder, single episode, severe with psychotic features: Secondary | ICD-10-CM

## 2022-11-20 MED ORDER — LAMOTRIGINE 150 MG PO TABS: 150.0000 mg | ORAL_TABLET | Freq: Every day | ORAL | 0 refills | Status: DC

## 2022-11-20 MED ORDER — VRAYLAR 1.5 MG PO CAPS: 1.5000 mg | ORAL_CAPSULE | Freq: Every day | ORAL | 0 refills | Status: DC

## 2022-11-24 ENCOUNTER — Other Ambulatory Visit: Payer: Self-pay | Admitting: Family Medicine

## 2022-11-24 DIAGNOSIS — F323 Major depressive disorder, single episode, severe with psychotic features: Secondary | ICD-10-CM

## 2022-11-24 MED ORDER — LORAZEPAM 1 MG PO TABS
1.0000 mg | ORAL_TABLET | Freq: Every day | ORAL | 0 refills | Status: DC | PRN
Start: 2022-11-24 — End: 2023-01-01

## 2022-11-24 MED ORDER — TRAZODONE HCL 300 MG PO TABS: 300.0000 mg | ORAL_TABLET | Freq: Every day | ORAL | 0 refills | Status: AC

## 2022-11-24 MED ORDER — SERTRALINE HCL 200 MG PO CAPS: 1.0000 | ORAL_CAPSULE | Freq: Every morning | ORAL | 0 refills | Status: DC

## 2022-11-25 ENCOUNTER — Telehealth: Payer: Self-pay | Admitting: Family Medicine

## 2022-11-25 ENCOUNTER — Other Ambulatory Visit: Payer: Self-pay

## 2022-11-25 MED ORDER — LAMOTRIGINE 150 MG PO TABS: 150.0000 mg | ORAL_TABLET | Freq: Every day | ORAL | 0 refills | Status: DC

## 2022-11-25 NOTE — Telephone Encounter (Signed)
Patient returning nurse call [phone] to discuss medicine changing around

## 2022-11-25 NOTE — Telephone Encounter (Signed)
Spoke with patient in regards to medication and sent medication in. Patient is requesting to switch providers due to current PCP not communicating and not getting patient medications sent in.

## 2022-11-25 NOTE — Telephone Encounter (Signed)
Patient called request to speak to nurse or provider, she is not getting no response by mychart or returning a call.  About the medicine that is being sent into her pharmacy incorrectly.  Teams message sent to speak to nurse or provider. TEAMS Important! I have patient Danielle Sweeney patient viky corpe holding my phone (park not working)  she said needs to speak to nurse about her medicine and no one has return her call or responded to [place] and requesting to speak to nurse. holding my phone

## 2022-11-25 NOTE — Telephone Encounter (Signed)
fyi  TEAMS: patient sent mychart message on 08.29.2024 about her medicine and Dr Durwin Nora replied saying he did not approve this switch..  BUT I have looked in mychart on 06.17.2024 patient was wanting to switch to Dr Durwin Nora , on 06.18.2024 at 1:01 pm Dr Durwin Nora approved this switch.  Looks like this needs to be readdress. Patient is requesting Merry Proud a call back to [phone] regarding discuscion on her medicine that this mychart message was sent on 08.29.2024.

## 2022-11-26 ENCOUNTER — Other Ambulatory Visit: Payer: Self-pay

## 2022-11-26 DIAGNOSIS — F323 Major depressive disorder, single episode, severe with psychotic features: Secondary | ICD-10-CM

## 2022-12-01 ENCOUNTER — Other Ambulatory Visit: Payer: Self-pay

## 2022-12-01 ENCOUNTER — Encounter: Payer: Self-pay | Admitting: Family Medicine

## 2022-12-01 MED ORDER — ONDANSETRON HCL 4 MG PO TABS: 4.0000 mg | ORAL_TABLET | ORAL | 1 refills | Status: DC

## 2022-12-03 ENCOUNTER — Ambulatory Visit: Payer: Medicare Other

## 2022-12-07 ENCOUNTER — Ambulatory Visit: Payer: Medicare Other

## 2022-12-10 ENCOUNTER — Ambulatory Visit: Payer: Medicare Other | Admitting: Family Medicine

## 2022-12-10 ENCOUNTER — Telehealth: Payer: Self-pay | Admitting: Family Medicine

## 2022-12-10 NOTE — Telephone Encounter (Signed)
Please tell her to call Dr Gailen Shelter office at [phone]. They have been calling her but her VM is full. Thanks

## 2022-12-10 NOTE — Telephone Encounter (Signed)
Patient called in regard to [facility] referral. States that she has not heard anything back in regard. She is uninterested in seeing a clinic for services. Patient would like referral placed with Peggy Bynum w. [facility]  services.  Patient wants a cll back in regard

## 2022-12-10 NOTE — Telephone Encounter (Signed)
Patient advise, she will contact Dr Virgil Benedict office

## 2022-12-14 NOTE — Telephone Encounter (Signed)
Spoke with patient gave her information to [facility] per pt request.

## 2022-12-14 NOTE — Telephone Encounter (Signed)
Please ask patient to call [phone]- [facility]

## 2022-12-14 NOTE — Telephone Encounter (Signed)
Pt LVM on Fri says the MD she was referred to for [facility] is not willing to prescribe medications she has been taking for years- not sure what to do. Please advise Thank you

## 2022-12-15 ENCOUNTER — Ambulatory Visit (INDEPENDENT_AMBULATORY_CARE_PROVIDER_SITE_OTHER): Payer: Medicare Other | Admitting: Gastroenterology

## 2022-12-15 ENCOUNTER — Ambulatory Visit: Payer: Medicare Other

## 2022-12-16 ENCOUNTER — Other Ambulatory Visit (HOSPITAL_COMMUNITY): Payer: Self-pay | Admitting: Family Medicine

## 2022-12-16 ENCOUNTER — Inpatient Hospital Stay
Admission: RE | Admit: 2022-12-16 | Discharge: 2022-12-16 | Disposition: A | Discharge status: Home / Self Care | Payer: Self-pay | Source: Ambulatory Visit | Attending: Family Medicine | Admitting: Family Medicine

## 2022-12-16 ENCOUNTER — Encounter: Payer: Self-pay | Admitting: Family Medicine

## 2022-12-16 DIAGNOSIS — R928 Other abnormal and inconclusive findings on diagnostic imaging of breast: Secondary | ICD-10-CM

## 2022-12-16 DIAGNOSIS — Z1231 Encounter for screening mammogram for malignant neoplasm of breast: Secondary | ICD-10-CM

## 2022-12-17 ENCOUNTER — Ambulatory Visit (HOSPITAL_COMMUNITY)
Admission: RE | Admit: 2022-12-17 | Discharge: 2022-12-17 | Disposition: A | Discharge status: Home / Self Care | Payer: Medicare Other | Source: Ambulatory Visit | Attending: Family Medicine | Admitting: Family Medicine

## 2022-12-17 ENCOUNTER — Encounter (HOSPITAL_COMMUNITY): Payer: Self-pay

## 2022-12-17 DIAGNOSIS — R92321 Mammographic fibroglandular density, right breast: Secondary | ICD-10-CM | POA: Diagnosis not present

## 2022-12-17 DIAGNOSIS — N6315 Unspecified lump in the right breast, overlapping quadrants: Secondary | ICD-10-CM | POA: Diagnosis not present

## 2022-12-17 DIAGNOSIS — R928 Other abnormal and inconclusive findings on diagnostic imaging of breast: Secondary | ICD-10-CM | POA: Diagnosis not present

## 2022-12-18 ENCOUNTER — Ambulatory Visit: Payer: Medicare Other | Admitting: Adult Health

## 2022-12-22 ENCOUNTER — Encounter: Payer: Self-pay | Admitting: Family Medicine

## 2022-12-23 ENCOUNTER — Other Ambulatory Visit: Payer: Self-pay | Admitting: Family Medicine

## 2022-12-23 DIAGNOSIS — Z8659 Personal history of other mental and behavioral disorders: Secondary | ICD-10-CM

## 2022-12-24 ENCOUNTER — Ambulatory Visit: Payer: Medicare Other | Admitting: Adult Health

## 2022-12-29 ENCOUNTER — Encounter: Payer: Self-pay | Admitting: Neurology

## 2022-12-29 ENCOUNTER — Ambulatory Visit: Payer: Medicare Other | Admitting: Neurology

## 2022-12-29 VITALS — BP 128/86 | HR 91 | Ht 65.5 in | Wt 127.6 lb

## 2022-12-29 DIAGNOSIS — G8929 Other chronic pain: Secondary | ICD-10-CM

## 2022-12-29 DIAGNOSIS — R29898 Other symptoms and signs involving the musculoskeletal system: Secondary | ICD-10-CM

## 2022-12-29 DIAGNOSIS — M5442 Lumbago with sciatica, left side: Secondary | ICD-10-CM

## 2022-12-29 DIAGNOSIS — G43709 Chronic migraine without aura, not intractable, without status migrainosus: Secondary | ICD-10-CM

## 2022-12-29 DIAGNOSIS — R296 Repeated falls: Secondary | ICD-10-CM

## 2022-12-29 DIAGNOSIS — R202 Paresthesia of skin: Secondary | ICD-10-CM

## 2022-12-29 DIAGNOSIS — R269 Unspecified abnormalities of gait and mobility: Secondary | ICD-10-CM

## 2022-12-29 NOTE — Progress Notes (Unsigned)
[facility]    Provider:  Dr Lucia Gaskins Requesting Provider: Wylene Men* Primary Care Provider:  Rica Records, FNP  CC:  Migraines  HPI:  Danielle Sweeney is a 52 y.o. female here as requested by Wylene Men* for migraines. 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 Pulsating/pounding on the right, light and sound sensitivity, nausea, she feels a dull ache in the spot where she hit her head and her face will feel funny and works her way around to the back and to her neck. She occ has radiation to the left and has the migraine behind the left eye. She was on sumatriptan in the past and now takes hours to work minimally. She has gastro issues. On ajovy did fantastic 3 months not one headache in [place] in a row but could not get it approved here in [place]. She tried emgality and not effective. Headaches daily and 15 moderate to severe migraine days a month and when she was on Ajovyshe had >90% improvement in freq and severity. She has nausea and vomiting, migraines can come on very quickly or in the morning when waking.  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 Had a revision a year ago. She had an emg. Completed. She has to use a cane. If she walks too far legs weak and has to sit for some time, she has to shift her weight constantly, leg is numb and gives out, has fallen. In the left leg. Shooting severe nerve pain. Has tried 4 ESIs, had tried cymbalta, gabapentin, lyrica, effexor, tramadol without relief. No other focal neurologic deficits, associated symptoms, inciting events or modifiable factors.   Reviewed notes, labs and imaging from outside physicians, which showed:   From a thorough  review of records and patient report, medications tried for > 3 months that can be used in migraines: emgalkity, Ajovy, amlodipine, abilify, lexapro, topamax, lamictal, depakote, gabapentin, zofran, propranolol, sertraline, imitrex, amitriptyline,trazodone, emgality ineffective, Aimovig is contraindicated, effexor, imitrex,  She has gastro issues and constipation so Aimovig is contraindicated  MRI lumbar spine IMPRESSION: 1. At L5-S1 there is a broad-based disc bulge. Left laminectomy. Mild epidural fibrosis along the left lateral aspect of the thecal sac. No left foraminal stenosis. Mild right foraminal stenosis. Moderate bilateral facet arthropathy. 2. At L4-5 there is a broad-based disc bulge. Mild bilateral facet arthropathy. Bilateral subarticular recess stenosis. 3.  No acute osseous injury of the lumbar spine  Review of Systems: Patient complains of symptoms per HPI as well as the following symptoms none. Pertinent negatives and positives per HPI. All others negative.   Social History   Socioeconomic History   Marital status: Single    Spouse name: Not on file   Number of children: Not on file   Years of education: Not on file   Highest education level: Some college, no degree  Occupational History   Not on file  Tobacco Use   Smoking status: Some Days    Types: E-cigarettes    Passive exposure: Current   Smokeless tobacco: Never   Tobacco comments:    Started smoking cigarettes 5 years ago. Smokes 12 sticks a day. Currently smoking  4 cigarettes daily.  Vaping Use   Vaping status: Former   Devices: quit 06/2021  Substance and Sexual Activity   Alcohol use: Yes    Comment: 2 drinks per month   Drug use: No   Sexual activity: Not Currently    Birth control/protection: I.U.D.  Other Topics Concern   Not on file  Social History Narrative   Lives with her mother, on disability for mental health and chronic back pain   3 cips colffee per day   Lives home alone    Disabled   College classes 90 credits    2 children   Social Determinants of Health   Financial Resource Strain: Low Risk  (10/07/2022)   Overall Financial Resource Strain (CARDIA)    Difficulty of Paying Living Expenses: Not hard at all  Food Insecurity: No Food Insecurity (10/07/2022)   Hunger Vital Sign    Worried About Running Out of Food in the Last Year: Never true    Ran Out of Food in the Last Year: Never true  Recent Concern: Food Insecurity - Food Insecurity Present (09/17/2022)   Hunger Vital Sign    Worried About Running Out of Food in the Last Year: Sometimes true    Ran Out of Food in the Last Year: Sometimes true  Transportation Needs: No Transportation Needs (10/07/2022)   PRAPARE - Administrator, Civil Service (Medical): No    Lack of Transportation (Non-Medical): No  Physical Activity: Insufficiently Active (10/07/2022)   Exercise Vital Sign    Days of Exercise per Week: 2 days    Minutes of Exercise per Session: 10 min  Stress: Stress Concern Present (10/07/2022)   [employer] of Occupational Health - Occupational Stress Questionnaire    Feeling of Stress : Rather much  Social Connections: Moderately Integrated (10/07/2022)   Social Connection and Isolation Panel [NHANES]    Frequency of Communication with Friends and Family: More than three times a week    Frequency of Social Gatherings with Friends and Family: Twice a week    Attends Religious Services: More than 4 times per year    Active Member of [employer] or Organizations: Yes    Attends Engineer, structural: More than 4 times per year    Marital Status: Divorced  Recent Concern: Social Connections - Moderately Isolated (09/17/2022)   Social Connection and Isolation Panel [NHANES]    Frequency of Communication with Friends and Family: More than three times a week    Frequency of Social Gatherings with Friends and Family: Twice a week    Attends Religious Services: More than 4 times per  year    Active Member of [employer] or Organizations: No    Attends Banker Meetings: Never    Marital Status: Divorced  Catering manager Violence: Not At Risk (10/07/2022)   Humiliation, Afraid, Rape, and Kick questionnaire    Fear of Current or Ex-Partner: No    Emotionally Abused: No    Physically Abused: No    Sexually Abused: No    Family History  Problem Relation Age of Onset   Hypertension Mother    Heart failure Mother    Sickle cell anemia Father    Brain cancer Father    Heart attack Sister    Asthma Brother    Hypertension Maternal Grandmother    Heart attack Maternal Grandmother    Diabetes type II Maternal Grandmother    Lung cancer Maternal Grandfather    Breast cancer  Neg Hx    Colon cancer Neg Hx    Migraines Neg Hx     Past Medical History:  Diagnosis Date   Bipolar 1 disorder (HCC)    Depression    GERD (gastroesophageal reflux disease)    H/O anorexia nervosa    Migraines    Suicide attempt [facility])     Patient Active Problem List   Diagnosis Date Noted   IUD (intrauterine device) in place 09/17/2022   Night sweats 09/17/2022   Hot flashes 09/17/2022   Menopause 09/17/2022   Community acquired pneumonia 09/13/2022   Chest pain 07/17/2022   Diarrhea 06/17/2022   Abdominal bloating 06/17/2022   Nausea without vomiting 06/17/2022   Anxiety 06/09/2022   Status post lumbar spine surgery for decompression of spinal cord 01/07/2022   Chronic midline low back pain with left-sided sciatica 11/12/2021   Schizoaffective schizophrenia (HCC) 08/22/2021   Uterine leiomyoma 08/22/2021   Lumbar hernia 08/22/2021   Abnormal uterine bleeding unrelated to menstrual cycle 07/14/2021   Cervical polyp 07/14/2021   Vitamin D deficiency 04/04/2021   GERD (gastroesophageal reflux disease) 04/04/2021   Constipation 04/04/2021   Sciatica 04/04/2021   Migraine 04/04/2021   Schizophrenia (HCC) 04/04/2021   Bipolar 2 disorder (HCC) 04/04/2021   Abnormal chest  CT 04/04/2021   Microcytosis 04/04/2021   Hyperlipemia 04/04/2021   Hypertension 04/04/2021   Urinary frequency 04/04/2021   Unintentional weight loss 04/04/2021   Chronic idiopathic constipation 02/26/2020   Health education 02/26/2020   Postlaminectomy syndrome, lumbar region 09/16/2018   Bipolar disorder, in full remission, most recent episode depressed (HCC) 10/22/2016   Pure hypercholesterolemia 10/22/2016   Severe single current episode of major depressive disorder, with psychotic features (HCC)    MDD (major depressive disorder) 11/22/2015   Anemia 06/08/1982    Past Surgical History:  Procedure Laterality Date   BACK SURGERY Bilateral 2015   BREAST BIOPSY  1989   benign   BUNIONECTOMY     CHOLECYSTECTOMY     CHOLECYSTECTOMY     LAMINECTOMY     2   LIPOMA EXCISION      Current Outpatient Medications  Medication Sig Dispense Refill   b complex vitamins capsule Take 1 capsule by mouth daily.     cholecalciferol (VITAMIN D3) 25 MCG (1000 UNIT) tablet Take 2,500 Units by mouth daily.     famotidine (PEPCID) 40 MG tablet Take 1 tablet (40 mg total) by mouth daily. 30 tablet 3   Fremanezumab-vfrm (AJOVY) 225 MG/1.5ML SOAJ Inject 225 mg into the skin every 30 (thirty) days. 1.5 mL 11   INGREZZA 40 MG capsule Take 40 mg by mouth daily.     lamoTRIgine (LAMICTAL) 150 MG tablet Take 1 tablet (150 mg total) by mouth daily. 30 tablet 0   levonorgestrel (LILETTA, 52 MG,) 20.1 MCG/DAY IUD IUD 1 each by Intrauterine route once.     Lifitegrast (XIIDRA) 5 % SOLN Apply to eye. Uses both eyes as needed.     LORazepam (ATIVAN) 1 MG tablet Take 1 tablet (1 mg total) by mouth daily as needed for anxiety. 30 tablet 0   Multiple Vitamin (MULTIVITAMIN WITH MINERALS) TABS tablet Take 1 tablet by mouth. 3 times a week     ondansetron (ZOFRAN) 4 MG tablet Take 1 tablet (4 mg total) by mouth as directed. 20 tablet 1   SUMAtriptan (TOSYMRA) 10 MG/ACT SOLN Place 1 spray into the nose every hour as  needed (for up to 3 doses a day). 6  each 11   trazodone (DESYREL) 300 MG tablet Take 1 tablet (300 mg total) by mouth at bedtime. 30 tablet 0   nitroGLYCERIN (NITROSTAT) 0.4 MG SL tablet Place 1 tablet (0.4 mg total) under the tongue every 5 (five) minutes as needed for chest pain. 50 tablet 3   omeprazole (PRILOSEC) 40 MG capsule Take 1 capsule (40 mg total) by mouth 2 (two) times daily. 60 capsule 2   Sertraline HCl 200 MG CAPS Take 1 capsule (200 mg total) by mouth every morning. 30 capsule 0   SUMAtriptan (IMITREX) 100 MG tablet TAKE 1 TABLET(100 MG) BY MOUTH DAILY AS NEEDED FOR MIGRAINE 30 tablet 0   No current facility-administered medications for this visit.    Allergies as of 12/29/2022 - Review Complete 12/29/2022  Allergen Reaction Noted   Codeine Itching 11/21/2015   Percocet [oxycodone-acetaminophen] Itching 11/21/2015    Vitals: BP 128/86   Pulse 91   Ht 5' 5.5" (1.664 m)   Wt 127 lb 9.6 oz (57.9 kg)   BMI 20.91 kg/m  Last Weight:  Wt Readings from Last 1 Encounters:  12/29/22 127 lb 9.6 oz (57.9 kg)   Last Height:   Ht Readings from Last 1 Encounters:  12/29/22 5' 5.5" (1.664 m)     Physical exam: Exam: Gen: NAD, conversant, well nourised, well groomed                     CV: RRR, no MRG. No Carotid Bruits. No peripheral edema, warm, nontender Eyes: Conjunctivae clear without exudates or hemorrhage TD of the mouth Neuro: Detailed Neurologic Exam  Speech:    Speech is normal; fluent and spontaneous with normal comprehension.  Cognition:    The patient is oriented to person, place, and time;     recent and remote memory intact;     language fluent;     normal attention, concentration,     fund of knowledge Cranial Nerves:    The pupils are equal, round, and reactive to light. The fundi are normal and spontaneous venous pulsations are present. Visual fields are full to finger confrontation. Extraocular movements are intact. Trigeminal sensation is intact  and the muscles of mastication are normal. The face is symmetric. The palate elevates in the midline. Hearing intact. Voice is normal. Shoulder shrug is normal. The tongue has normal motion without fasciculations.   Coordination:    Normal finger to nose and heel to shin. Normal rapid alternating movements.   Gait:    Heel-toe and tandem gait are normal.   Motor Observation:    No asymmetry, no atrophy, and no involuntary movements noted. Tone:    Normal muscle tone.    Posture:    Posture is normal. normal erect    Strength:    LLE weakness due to pain and failed back surgeries: 3+/5 left hip flexion due to pain, 4/5 left leg flexion, intact dorsiflexion.      Sensation: intact to LT     Reflex Exam:  DTR's: Absent AJ left, left patellar more brisk than the right. 2+ bicpes.  Toes:    The toes are downgoing bilaterally.   Clonus:    Clonus is absent.    Assessment/Plan:  Patient with chronic low back pain and chronic migraines.  Low back pain with sciatica and leg weakness which is worsening: She has been under the care of physicians including primary care, orthopedics, neurology for her back pain which is chronic ongoing for years with several  lumbar surgeries.  She continues to have significant pain in the left leg, weakness in the left leg, gait abnormality.  MRi lumbar spine and then refer back to dr Yevette Edwards for eval of any further surgical interventions, ESI, spinal stim and/or other surgical procedures Chronic migraine prevention: Ajovy in the past worked exceptionally well, her migraine frequency and severity was reduced by 90%, she had issues with insurance at this time we will represcribe and try to get it approved. Chronic migraine acute: Tosymra nasal spray acutely.  Imitrex worked well in the past but now is partially effective and takes a very long time, she tried imitrex nasal in the past as well and generic formulation was not effective, currently we have Tosymra  which is much more effective than the prior Imitrex generic nasal spray will prescribe.  From a thorough review of records and patient report, medications tried for > 3 months that can be used in migraines: emgalkity, Ajovy, amlodipine, abilify, lexapro, topamax, lamictal, depakote, gabapentin, zofran, propranolol, sertraline, imitrex, amitriptyline,trazodone, emgality ineffective, Aimovig is contraindicated, effexor, imitrex p.o. and nasal,  She has gastro issues and constipation so Aimovig is contraindicated  TOSYMRA justification:  Patient has migraines of varying presentation, including some with nausea/vomiting, morning onset and rapid-onset requiring a non-oral AND oral triptan to manage all migraine types. AHS recommends a non-oral triptan for such patients. She has failed an adequate trial of generic sumatriptan injection [4 mg or 6 mg] due to [intolerable dose-related side effect].  Zembrace SymTouch is the only autoinjector to offer a lower, 3 mg dose. 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  Therefore, requires an therapy that bypasses the GI tract.    Orders Placed This Encounter  Procedures   MR Lumbar Spine W Wo Contrast   Meds ordered this encounter  Medications   Fremanezumab-vfrm (AJOVY) 225 MG/1.5ML SOAJ    Sig: Inject 225 mg into the skin every 30 (thirty) days.    Dispense:  1.5 mL    Refill:  11   SUMAtriptan (TOSYMRA) 10 MG/ACT SOLN    Sig: Place 1 spray into the nose every hour as needed (for up to 3 doses a day).    Dispense:  6 each    Refill:  11    Cc: Del Newman Nip, [place]*,  Del Newman Nip, [place], [place]  Naomie Dean, MD  [facility] [address] [place], [place] [address]  Phone [phone] Fax [phone]

## 2022-12-29 NOTE — Patient Instructions (Addendum)
Prescribe Ajovy Try Tosymra nasal spray MRi lumbar spine then refer back to dr Yevette Edwards for eval of ESI, spinal stim and/or other surgical procedures Mri brain

## 2022-12-30 ENCOUNTER — Encounter: Payer: Self-pay | Admitting: Family Medicine

## 2022-12-30 ENCOUNTER — Other Ambulatory Visit: Payer: Self-pay

## 2022-12-30 ENCOUNTER — Encounter: Payer: Self-pay | Admitting: Neurology

## 2022-12-30 DIAGNOSIS — Z8659 Personal history of other mental and behavioral disorders: Secondary | ICD-10-CM

## 2022-12-30 DIAGNOSIS — G43909 Migraine, unspecified, not intractable, without status migrainosus: Secondary | ICD-10-CM

## 2022-12-30 DIAGNOSIS — R079 Chest pain, unspecified: Secondary | ICD-10-CM

## 2022-12-30 DIAGNOSIS — K219 Gastro-esophageal reflux disease without esophagitis: Secondary | ICD-10-CM

## 2022-12-30 MED ORDER — OMEPRAZOLE 40 MG PO CPDR: 40.0000 mg | DELAYED_RELEASE_CAPSULE | Freq: Two times a day (BID) | ORAL | 2 refills | Status: AC

## 2022-12-30 MED ORDER — TOSYMRA 10 MG/ACT NA SOLN: 1.0000 | NASAL | 11 refills | Status: AC | PRN

## 2022-12-30 MED ORDER — SERTRALINE HCL 200 MG PO CAPS: 1.0000 | ORAL_CAPSULE | Freq: Every morning | ORAL | 0 refills | Status: DC

## 2022-12-30 MED ORDER — AJOVY 225 MG/1.5ML ~~LOC~~ SOAJ
225.0000 mg | SUBCUTANEOUS | 11 refills | Status: AC
Start: 2022-12-30 — End: ?

## 2022-12-30 MED ORDER — NITROGLYCERIN 0.4 MG SL SUBL: 0.4000 mg | SUBLINGUAL_TABLET | SUBLINGUAL | 3 refills | Status: AC | PRN

## 2022-12-30 MED ORDER — SUMATRIPTAN SUCCINATE 100 MG PO TABS: ORAL_TABLET | ORAL | 0 refills | Status: AC

## 2022-12-30 NOTE — Telephone Encounter (Signed)
Please ask patient did she secure an appointment?

## 2023-01-01 ENCOUNTER — Other Ambulatory Visit: Payer: Self-pay | Admitting: Family Medicine

## 2023-01-01 ENCOUNTER — Other Ambulatory Visit: Payer: Self-pay | Admitting: Nurse Practitioner

## 2023-01-01 DIAGNOSIS — F323 Major depressive disorder, single episode, severe with psychotic features: Secondary | ICD-10-CM

## 2023-01-04 ENCOUNTER — Telehealth: Payer: Self-pay | Admitting: Neurology

## 2023-01-04 ENCOUNTER — Telehealth: Payer: Self-pay

## 2023-01-04 MED ORDER — AJOVY 225 MG/1.5ML ~~LOC~~ SOAJ: 225.0000 mg | SUBCUTANEOUS | 0 refills | Status: DC

## 2023-01-04 NOTE — Telephone Encounter (Signed)
Contacted pt to come pick up Ajovy Samples x2. LVM rq call back. Will also send [facility] msg.

## 2023-01-04 NOTE — Telephone Encounter (Signed)
[employer] medicare Berkley Harvey: [id number] exp. 01/04/23-02/02/23 sent to GI [phone]

## 2023-01-05 DIAGNOSIS — Z5181 Encounter for therapeutic drug level monitoring: Secondary | ICD-10-CM | POA: Diagnosis not present

## 2023-01-05 DIAGNOSIS — F250 Schizoaffective disorder, bipolar type: Secondary | ICD-10-CM | POA: Diagnosis not present

## 2023-01-05 DIAGNOSIS — F1320 Sedative, hypnotic or anxiolytic dependence, uncomplicated: Secondary | ICD-10-CM | POA: Diagnosis not present

## 2023-01-05 DIAGNOSIS — F410 Panic disorder [episodic paroxysmal anxiety] without agoraphobia: Secondary | ICD-10-CM | POA: Diagnosis not present

## 2023-01-07 NOTE — Telephone Encounter (Signed)
Pt called the office and was notified it is ready to pick up at [facility], the office is open Mon-Thur 8-5.

## 2023-01-07 NOTE — Telephone Encounter (Signed)
Pt called to confirm Ajovy sample are ready for her to come pick up before she request a Uber. Messaged the nurse, she said medication is ready. Pt said she is on her way.

## 2023-01-07 NOTE — Telephone Encounter (Signed)
Samples were picked up X2 boxes.

## 2023-01-11 NOTE — Telephone Encounter (Signed)
Request for Ajovy PA sent to PA team.

## 2023-01-11 NOTE — Telephone Encounter (Signed)
Pt called and LVM stating that her insurance is requesting more information about all the medications that have been tried and did not work for her to be able to use the Ajovy. Please advise.

## 2023-01-12 NOTE — Telephone Encounter (Signed)
Pt said, insurance company will not cover Ajovy. Asking Dr. Lucia Gaskins to resubmit why need to medication. Insurance is offering medication that they will cover, but have already taken that did not work. No need to call unless have any. Questions.

## 2023-01-13 ENCOUNTER — Other Ambulatory Visit (HOSPITAL_COMMUNITY): Payer: Self-pay

## 2023-01-13 ENCOUNTER — Telehealth: Payer: Self-pay

## 2023-01-13 NOTE — Telephone Encounter (Signed)
Pharmacy Patient Advocate Encounter   Received notification from Physician's Office that prior authorization for AJOVY (fremanezumab-vfrm) injection 225MG /1.5ML auto-injectors is required/requested.   Insurance verification completed.   The patient is insured through [facility] .   Per test claim: PA required; PA started via CoverMyMeds. KEY [id number] . Waiting for clinical questions to populate.

## 2023-01-13 NOTE — Telephone Encounter (Signed)
PA request has been Submitted. New Encounter created for follow up. For additional info see Pharmacy Prior Auth telephone encounter from 01/13/2023.

## 2023-01-19 ENCOUNTER — Telehealth: Payer: Self-pay | Admitting: Neurology

## 2023-01-19 ENCOUNTER — Ambulatory Visit (INDEPENDENT_AMBULATORY_CARE_PROVIDER_SITE_OTHER): Payer: Medicare Other | Admitting: Gastroenterology

## 2023-01-19 DIAGNOSIS — R110 Nausea: Secondary | ICD-10-CM

## 2023-01-19 DIAGNOSIS — K219 Gastro-esophageal reflux disease without esophagitis: Secondary | ICD-10-CM

## 2023-01-19 DIAGNOSIS — R197 Diarrhea, unspecified: Secondary | ICD-10-CM

## 2023-01-19 DIAGNOSIS — R140 Abdominal distension (gaseous): Secondary | ICD-10-CM | POA: Diagnosis not present

## 2023-01-19 MED ORDER — ONDANSETRON HCL 4 MG PO TABS: 4.0000 mg | ORAL_TABLET | Freq: Three times a day (TID) | ORAL | 1 refills | Status: AC | PRN

## 2023-01-19 MED ORDER — FAMOTIDINE 40 MG PO TABS: 40.0000 mg | ORAL_TABLET | Freq: Every day | ORAL | 5 refills | Status: AC

## 2023-01-19 NOTE — Patient Instructions (Addendum)
Continue with omeprazole 40mg  twice daily and famotidine as needed I have sent a refill of zofran to use as needed for nausea Please continue with recommendations from pelvic floor therapist We will also complete breath testing to determine if you have an issue breaking down sucrose  Follow up 6 months  It was a pleasure to see you today. I want to create trusting relationships with patients and provide genuine, compassionate, and quality care. I truly value your feedback! please be on the lookout for a survey regarding your visit with me today. I appreciate your input about our visit and your time in completing this!    Nickie Deren L. Jeanmarie Hubert, MSN, APRN, AGNP-C Adult-Gerontology Nurse Practitioner [facility]

## 2023-01-19 NOTE — Telephone Encounter (Signed)
Pt called needing to discuss her Ajovy copay with RN. Please advise.

## 2023-01-19 NOTE — Progress Notes (Addendum)
Primary Care Physician:  Rica Records, FNP  Primary GI: Dr. Levon Hedger   Patient Location: Home   Provider Location: [facility]   Reason for Visit: follow up of IBS and GERD   Persons present on the virtual encounter, with roles: Rayland Hamed L. Jeanmarie Hubert, MSN, APRN, AGNP-C, Danielle Sweeney patient    Total time (minutes) spent on medical discussion: 15 minutes  Virtual Visit via Telephone visit is conducted virtually and was requested by patient.   I connected with Danielle Sweeney on 01/19/23 at  8:15 AM EDT by telephone and verified that I am speaking with the correct person using two identifiers.   I discussed the limitations, risks, security and privacy concerns of performing an evaluation and management service by telephone and the availability of in person appointments. I also discussed with the patient that there may be a patient responsible charge related to this service. The patient expressed understanding and agreed to proceed.  Chief Complaint  Patient presents with   Diarrhea    Patient doing a telephone visit. Follow up on diarrhea. Takes questran occasionally and it helps.    Gastroesophageal Reflux    Follow up on GERD. Takes omeprazole 40mg  bid and famotidine as needed. States meds are controlling her symptoms and wanted to know if she could get refills. States prescribed by pcp currently.    History of Present Illness: Danielle Sweeney is a 52 y.o. female with past medical history of Bipolar 1 disorder, depression, GERD, anorexia, IBS-M    Last seen may 2024, at that time she took [place] x1 month which stopped her diarrhea. She is no longer taking this. Was having BMs for 2 days with formed stools but some discomfort with pushing stools out. She felt that [place] caused her to be constipated. She has been having diarrhea now, all weekend, twice Friday, once on Sunday and 3x yesterday.  some abdominal discomfort as well.  did not do SIBO testing  previously because she was unsure of why she was having this testing.  continues to have a lot of gas/flatulence as well as nausea, though she feels nausea has improved some.  taking omeprazole 40mg  BID and famotidine 40mg  daily with good results. Only taking zofran as needed.    Recommended to continue PPI BID, H2B daily, SIBO test, referral to pelvic floor therapy, questran 4g daily x1 week, continue zofran  Present: States she had SIBO breath testing done, she notes during the test she had a lot of burning and pain in her stomach when she had to drink the solution for the test. She is still having some abdominal pain in mid abdomen. This occurs maybe a few times per week, she also notes some RLQ and LLQ pain. She notes ongoing bloating as well.  GERD is well controlled on omeprazole 40mg  BID, taking famotidine 40mg  PRN. She feels that omeprazole helps some with her upper abdominal pain. Appetite is up and down. No rectal bleeding or melena. She is taking zofran PRN for nausea, maybe every other day.   She did meet with pelvic floor therapy and had 1 session with them due to being unable to get a ride back there. She notes she has been going to the restroom very easily since then. Having BMs almost daily for the past few weeks. She is using a squatty potty and massaging her stomach to help her have easier BMs. She has started prebiotic benefiber as recommended by pelvic floor therapist, also doing a culturelle  IBS support probiotic. She takes [place] on occasion.   She notes she cannot tolerate her vitamin D, B12, and multivitamin as they bind her up and cause more constipation. she Inquired about vitamin injections.   Weighs 127 lbs this morning.   SIBO testing: may 2024 negative ARM: rectal dyssynergia  Celiac panel 06/2020 negative GES:11/2020 normal UGIS: 06/2020 normal PIllcam: unremarkable CT A/P 05/2020 with constiaption H pylori breath test 03/2022 negative Last Colonoscopy:03/2020,  tubular adenoma   Last Endoscopy:03/2020 chronic gastirits   Recommendations:  Repeat TCS 5 years   Past Medical History:  Diagnosis Date   Bipolar 1 disorder (HCC)    Depression    GERD (gastroesophageal reflux disease)    H/O anorexia nervosa    Migraines    Suicide attempt (HCC)     Past Surgical History:  Procedure Laterality Date   BACK SURGERY Bilateral 2015   BREAST BIOPSY  1989   benign   BUNIONECTOMY     CHOLECYSTECTOMY     CHOLECYSTECTOMY     LAMINECTOMY     2   LIPOMA EXCISION      Current Meds  Medication Sig   cholestyramine (QUESTRAN) 4 g packet Take 1 packet by mouth 2 (two) times daily.   famotidine (PEPCID) 40 MG tablet Take 1 tablet (40 mg total) by mouth daily.   Fremanezumab-vfrm (AJOVY) 225 MG/1.5ML SOAJ Inject 225 mg into the skin every 30 (thirty) days.   INGREZZA 40 MG capsule Take 40 mg by mouth daily.   lamoTRIgine (LAMICTAL) 150 MG tablet TAKE 1 TABLET(150 MG) BY MOUTH DAILY   levonorgestrel (LILETTA, 52 MG,) 20.1 MCG/DAY IUD IUD 1 each by Intrauterine route once.   Lifitegrast (XIIDRA) 5 % SOLN Apply to eye. Uses both eyes as needed.   LORazepam (ATIVAN) 1 MG tablet TAKE 1 TABLET(1 MG) BY MOUTH DAILY AS NEEDED FOR ANXIETY   nitroGLYCERIN (NITROSTAT) 0.4 MG SL tablet Place 1 tablet (0.4 mg total) under the tongue every 5 (five) minutes as needed for chest pain.   omeprazole (PRILOSEC) 40 MG capsule Take 1 capsule (40 mg total) by mouth 2 (two) times daily.   ondansetron (ZOFRAN) 4 MG tablet Take 1 tablet (4 mg total) by mouth as directed.   REXULTI 2 MG TABS tablet Take by mouth. One half tablet for one week then go to one whole tablet   Sertraline HCl 200 MG CAPS Take 1 capsule (200 mg total) by mouth every morning.   SUMAtriptan (IMITREX) 100 MG tablet TAKE 1 TABLET(100 MG) BY MOUTH DAILY AS NEEDED FOR MIGRAINE   trazodone (DESYREL) 300 MG tablet Take 1 tablet (300 mg total) by mouth at bedtime.   [DISCONTINUED] b complex vitamins capsule Take  1 capsule by mouth daily.     Family History  Problem Relation Age of Onset   Hypertension Mother    Heart failure Mother    Sickle cell anemia Father    Brain cancer Father    Heart attack Sister    Asthma Brother    Hypertension Maternal Grandmother    Heart attack Maternal Grandmother    Diabetes type II Maternal Grandmother    Lung cancer Maternal Grandfather    Breast cancer Neg Hx    Colon cancer Neg Hx    Migraines Neg Hx     Social History   Socioeconomic History   Marital status: Single    Spouse name: Not on file   Number of children: Not on file   Years  of education: Not on file   Highest education level: Some college, no degree  Occupational History   Not on file  Tobacco Use   Smoking status: Some Days    Types: E-cigarettes    Passive exposure: Current   Smokeless tobacco: Never   Tobacco comments:    Started smoking cigarettes 5 years ago. Smokes 12 sticks a day. Currently smoking 4 cigarettes daily.  Vaping Use   Vaping status: Former   Devices: quit 06/2021  Substance and Sexual Activity   Alcohol use: Yes    Comment: 2 drinks per month   Drug use: No   Sexual activity: Not Currently    Birth control/protection: I.U.D.  Other Topics Concern   Not on file  Social History Narrative   Lives with her mother, on disability for mental health and chronic back pain   3 cips colffee per day   Lives home alone   Disabled   College classes 90 credits    2 children   Social Determinants of Health   Financial Resource Strain: Low Risk  (10/07/2022)   Overall Financial Resource Strain (CARDIA)    Difficulty of Paying Living Expenses: Not hard at all  Food Insecurity: No Food Insecurity (10/07/2022)   Hunger Vital Sign    Worried About Running Out of Food in the Last Year: Never true    Ran Out of Food in the Last Year: Never true  Recent Concern: Food Insecurity - Food Insecurity Present (09/17/2022)   Hunger Vital Sign    Worried About Running Out of  Food in the Last Year: Sometimes true    Ran Out of Food in the Last Year: Sometimes true  Transportation Needs: No Transportation Needs (10/07/2022)   PRAPARE - Administrator, Civil Service (Medical): No    Lack of Transportation (Non-Medical): No  Physical Activity: Insufficiently Active (10/07/2022)   Exercise Vital Sign    Days of Exercise per Week: 2 days    Minutes of Exercise per Session: 10 min  Stress: Stress Concern Present (10/07/2022)   [employer] of Occupational Health - Occupational Stress Questionnaire    Feeling of Stress : Rather much  Social Connections: Moderately Integrated (10/07/2022)   Social Connection and Isolation Panel [NHANES]    Frequency of Communication with Friends and Family: More than three times a week    Frequency of Social Gatherings with Friends and Family: Twice a week    Attends Religious Services: More than 4 times per year    Active Member of [employer] or Organizations: Yes    Attends Engineer, structural: More than 4 times per year    Marital Status: Divorced  Recent Concern: Social Connections - Moderately Isolated (09/17/2022)   Social Connection and Isolation Panel [NHANES]    Frequency of Communication with Friends and Family: More than three times a week    Frequency of Social Gatherings with Friends and Family: Twice a week    Attends Religious Services: More than 4 times per year    Active Member of [employer] or Organizations: No    Attends Banker Meetings: Never    Marital Status: Divorced   Review of Systems: Gen: Denies fever, chills, anorexia. Denies fatigue, weakness, weight loss.  CV: Denies chest pain, palpitations, syncope, peripheral edema, and claudication. Resp: Denies dyspnea at rest, cough, wheezing, coughing up blood, and pleurisy. GI: see HPI Derm: Denies rash, itching, dry skin Psych: Denies depression, anxiety, memory loss, confusion. No  homicidal or suicidal ideation.  Heme: Denies  bruising, bleeding, and enlarged lymph nodes.  Observations/Objective: No distress. Unable to perform physical exam due to telephone encounter. No video available.   Assessment and Plan: Kalyiah Saintil is a 51 y.o. female with past medical history of Bipolar 1 disorder, depression, GERD, anorexia, IBS-M   Patient has had extensive workup in the past for her GI symptoms, to include most recently negative SIBO testing.  She does report significant burning and pain in her stomach while consuming solution for SIBO testing.  She reports ongoing mid abdominal pain and bloating a few times per week.  Will proceed with testing to rule out CSID given the symptoms.  GERD is well-controlled on omeprazole 40 mg twice daily, famotidine 40 mg as needed.  Will continue with current regimen.  Bowel habits have improved since she met with pelvic floor therapy, she is having bowel movement almost daily.  She is doing Investment banker, corporate and a probiotic which seems to also be helping.  Only doing Questran very occasionally.  Recommend she continue with the recommendations from pelvic floor therapist and try to meet with them again of possible.  She inquired about vitamin injections as she is unable to tolerate her Vitamin D, B12 or Multivitamin as these cause her to be constipated, I recommended she follow up with PCP regarding the need for vitamin injections.   -Continue omeprazole 40 mg twice daily -Continue famotidine 40 mg as needed -Zofran 4 mg every 8 hours as needed -Continue with recommendations from pelvic floor therapist -Continue Benefiber and probiotic -CSID testing  -Discussed vitamin injections with PCP   Follow Up Instructions: Follow up 6 months    I discussed the assessment and treatment plan with the patient. The patient was provided an opportunity to ask questions and all were answered. The patient agreed with the plan and demonstrated an understanding of the instructions.   The patient was  advised to call back or seek an in-person evaluation if the symptoms worsen or if the condition fails to improve as anticipated.  I provided 15 minutes of NON face-to-face time during this telephone encounter.  Janiyha Montufar L. Jeanmarie Hubert, MSN, APRN, AGNP-C Adult-Gerontology Nurse Practitioner [facility]  I have reviewed the note and agree with the APP's assessment as described in this progress note  Katrinka Blazing, MD Gastroenterology and Hepatology [facility]

## 2023-01-20 NOTE — Telephone Encounter (Signed)
Spoke with patient. She also received a call that Ajovy had been approved so she is very pleased. She now is asking about the Tosymra.  Probably needs PA.

## 2023-01-20 NOTE — Telephone Encounter (Signed)
Clinical questions have been submitted as urgent request-awaiting determination.

## 2023-01-20 NOTE — Telephone Encounter (Signed)
Blue Medicare/ [employer] [place]) Request for coverage has been approved. 01/20/23-01/20/24

## 2023-01-20 NOTE — Telephone Encounter (Signed)
I reached out to the PA team to see if her PA got approved. Waiting on response then will call patient back.

## 2023-01-20 NOTE — Telephone Encounter (Signed)
Any updates on this? Pt wants [place] to call her about her copay. I wanted to see if it got approved first.

## 2023-01-22 ENCOUNTER — Other Ambulatory Visit (HOSPITAL_COMMUNITY): Payer: Self-pay

## 2023-01-22 NOTE — Telephone Encounter (Signed)
Pharmacy Patient Advocate Encounter  Received notification from [facility] that Prior Authorization for AJOVY (fremanezumab-vfrm) injection 225MG /1.5ML auto-injectors has been APPROVED from 01/20/2023 to 01/20/2024. Ran test claim, Copay is $Unable to obtain due to refill too soon rejection. Last filled on 01/21/2023, next fill available on or after 02/13/2023. This test claim was processed through [facility]- copay amounts may vary at other pharmacies due to pharmacy/plan contracts, or as the patient moves through the different stages of their insurance plan.   PA #/Case ID/Reference #: PA Case ID #: [id number]

## 2023-01-25 ENCOUNTER — Ambulatory Visit: Payer: Medicare Other

## 2023-01-25 ENCOUNTER — Telehealth: Payer: Self-pay

## 2023-01-25 ENCOUNTER — Other Ambulatory Visit (HOSPITAL_COMMUNITY): Payer: Self-pay

## 2023-01-25 NOTE — Telephone Encounter (Signed)
Pharmacy Patient Advocate Encounter   Received notification from Physician's Office that prior authorization for Tosymra 10MG /ACT Nasal Spray is required/requested.   Insurance verification completed.   The patient is insured through [facility] .   Per test claim: PA required; PA started via CoverMyMeds. KEY [id number] . Waiting for clinical questions to populate.

## 2023-01-25 NOTE — Telephone Encounter (Signed)
The patient called and asked to have the MRI done at [facility]. I updated the facility to [facility] on her PA and sent the order to them to schedule. [phone]

## 2023-01-25 NOTE — Telephone Encounter (Signed)
PA request has been Submitted. New Encounter created for follow up. For additional info see Pharmacy Prior Auth telephone encounter from 01/25/2023.  Tosymra

## 2023-01-27 NOTE — Telephone Encounter (Signed)
Clinical questions have been submitted-awaiting determination. 

## 2023-01-28 ENCOUNTER — Ambulatory Visit (HOSPITAL_COMMUNITY): Payer: Medicare Other

## 2023-02-01 ENCOUNTER — Other Ambulatory Visit (HOSPITAL_COMMUNITY): Payer: Self-pay

## 2023-02-01 ENCOUNTER — Other Ambulatory Visit: Payer: Self-pay | Admitting: Family Medicine

## 2023-02-01 DIAGNOSIS — Z8659 Personal history of other mental and behavioral disorders: Secondary | ICD-10-CM

## 2023-02-01 NOTE — Telephone Encounter (Signed)
Pharmacy Patient Advocate Encounter  Received notification from [facility] that Prior Authorization for Tosymra 10MG /ACT Nasal Spray has been APPROVED from 01/27/2023 to 01/27/2024   PA #/Case ID/Reference #: N/A-was not listed in [facility] or approval letter

## 2023-02-02 ENCOUNTER — Ambulatory Visit (HOSPITAL_COMMUNITY): Payer: Medicare Other

## 2023-02-03 NOTE — Telephone Encounter (Signed)
BCBS medicare [place]kley Harvey: [id number] exp. 02/03/23-03/04/23

## 2023-02-06 ENCOUNTER — Ambulatory Visit (HOSPITAL_COMMUNITY)
Admission: RE | Admit: 2023-02-06 | Discharge: 2023-02-06 | Disposition: A | Discharge status: Home / Self Care | Payer: Medicare Other | Source: Ambulatory Visit | Attending: Neurology | Admitting: Neurology

## 2023-02-06 DIAGNOSIS — R269 Unspecified abnormalities of gait and mobility: Secondary | ICD-10-CM | POA: Insufficient documentation

## 2023-02-06 DIAGNOSIS — G8929 Other chronic pain: Secondary | ICD-10-CM | POA: Diagnosis not present

## 2023-02-06 DIAGNOSIS — M5442 Lumbago with sciatica, left side: Secondary | ICD-10-CM | POA: Insufficient documentation

## 2023-02-06 DIAGNOSIS — R202 Paresthesia of skin: Secondary | ICD-10-CM | POA: Diagnosis not present

## 2023-02-06 DIAGNOSIS — M47816 Spondylosis without myelopathy or radiculopathy, lumbar region: Secondary | ICD-10-CM | POA: Diagnosis not present

## 2023-02-06 DIAGNOSIS — R296 Repeated falls: Secondary | ICD-10-CM | POA: Insufficient documentation

## 2023-02-06 DIAGNOSIS — R29898 Other symptoms and signs involving the musculoskeletal system: Secondary | ICD-10-CM | POA: Insufficient documentation

## 2023-02-06 DIAGNOSIS — M4807 Spinal stenosis, lumbosacral region: Secondary | ICD-10-CM | POA: Diagnosis not present

## 2023-02-06 DIAGNOSIS — M51372 Other intervertebral disc degeneration, lumbosacral region with discogenic back pain and lower extremity pain: Secondary | ICD-10-CM | POA: Diagnosis not present

## 2023-02-06 DIAGNOSIS — M51362 Other intervertebral disc degeneration, lumbar region with discogenic back pain and lower extremity pain: Secondary | ICD-10-CM | POA: Diagnosis not present

## 2023-02-06 MED ORDER — GADOBUTROL 1 MMOL/ML IV SOLN
5.5000 mL | Freq: Once | INTRAVENOUS | Status: AC | PRN
  Administered 2023-02-06: 5.5 mL via INTRAVENOUS

## 2023-02-08 DIAGNOSIS — F410 Panic disorder [episodic paroxysmal anxiety] without agoraphobia: Secondary | ICD-10-CM | POA: Diagnosis not present

## 2023-02-08 DIAGNOSIS — F250 Schizoaffective disorder, bipolar type: Secondary | ICD-10-CM | POA: Diagnosis not present

## 2023-02-11 ENCOUNTER — Encounter: Payer: Self-pay | Admitting: Neurology

## 2023-02-11 ENCOUNTER — Telehealth: Payer: Self-pay | Admitting: *Deleted

## 2023-02-11 NOTE — Telephone Encounter (Signed)
Spoke to pt gave MRI results  Gave Dr Lucia Gaskins recommendations  Per pt moving to [place] during thanksgiving. Pt need next steps due to his move out of state . Forward to Dr Lucia Gaskins

## 2023-02-11 NOTE — Telephone Encounter (Signed)
-----   Message from [name] sent at 02/10/2023  7:21 PM EST ----- Please call and ask if we can send her back to Dr. Marissa Nestle due to findings on MRI: Hi Danielle Sweeney, THE MRI lumbar spine showed that you likely have pinched nerves causing the sciatica/nerve pain. I think you ned to go back to Dr. Marissa Nestle for evaluation and bring him this MRi (we will send a report to him). Can we refer you to him again? I will ask my nurses to call you tomorrow to ask thanks Sheralyn Boatman hern

## 2023-02-15 NOTE — Telephone Encounter (Signed)
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 No further imaging workup of this lesion is indicated.". So I would have her ask her primary care but seems to be benign.    She needs to get a copy of her MRI Lumbar spine on disk and bring it with her. If she can find a neurosurgeon or orthopaedic doctor name in the area she is moving, we can send a referral and she can bring the disk with her. thanks

## 2023-02-16 NOTE — Telephone Encounter (Signed)
Dr Lucia Gaskins discussed this with the pt in mychart encounter.

## 2023-03-03 ENCOUNTER — Other Ambulatory Visit: Payer: Medicare Other

## 2023-05-04 ENCOUNTER — Telehealth: Payer: Self-pay | Admitting: Neurology

## 2023-05-04 NOTE — Telephone Encounter (Signed)
Appointment cx, pt moved

## 2023-05-13 ENCOUNTER — Telehealth: Payer: Medicare Other | Admitting: Neurology

## 2023-05-26 ENCOUNTER — Telehealth: Payer: Self-pay | Admitting: Neurology

## 2023-05-26 ENCOUNTER — Encounter: Payer: Self-pay | Admitting: Neurology

## 2023-05-26 NOTE — Telephone Encounter (Signed)
 Pt LVM at 12:81 pm having trouble getting Ajovy injections. I was a patient with Dr. Lucia Gaskins when lived in [place]. Moved to [place] back home in November. Dr. Lucia Gaskins has to resubmit for the Ajovy medication. Would like a call back

## 2023-05-26 NOTE — Telephone Encounter (Signed)
 Patient also just sent [place] a MyChart message which I have responded to.

## 2023-06-09 ENCOUNTER — Other Ambulatory Visit (HOSPITAL_COMMUNITY): Payer: Self-pay

## 2023-06-09 ENCOUNTER — Telehealth: Payer: Self-pay

## 2023-06-09 NOTE — Telephone Encounter (Signed)
 I messaged the patient back in [place] where she had messaged [place].

## 2023-06-09 NOTE — Telephone Encounter (Signed)
 Noted thanks

## 2023-06-09 NOTE — Telephone Encounter (Signed)
 Pharmacy Patient Advocate Encounter   Received notification from Physician's Office that prior authorization for AJOVY (fremanezumab-vfrm) injection 225MG /1.5ML auto-injectors is required/requested.   Insurance verification completed.   The patient is insured through [employer] .   Per test claim: The current 28 day co-pay is, $0.  No PA needed at this time. This test claim was processed through [facility]- copay amounts may vary at other pharmacies due to pharmacy/plan contracts, or as the patient moves through the different stages of their insurance plan.

## 2023-06-09 NOTE — Telephone Encounter (Signed)
 Pt said only have 1 injection left of Ajovy, could Dr. Lucia Gaskins submit to [facility] to approve the Ajovy. Would like a call back. [employer] HMO Member ID: [id number] Group# [id number] BIN# [id number] PCN#  IS Phone [phone]

## 2023-06-15 ENCOUNTER — Telehealth: Payer: Self-pay

## 2023-06-15 ENCOUNTER — Other Ambulatory Visit (HOSPITAL_COMMUNITY): Payer: Self-pay

## 2023-06-15 NOTE — Telephone Encounter (Signed)
 Pharmacy Patient Advocate Encounter   Received notification from Fax that prior authorization for Tosymra 10MG /ACT Nasal Spray is required/requested.   Insurance verification completed.   The patient is insured through  [place]  .   Per test claim: PA required; PA submitted to above mentioned insurance via CoverMyMeds Key/confirmation #/EOC [id number] Status is pending  Received instant approval  Pharmacy Patient Advocate Encounter  Received notification from  [facility]  that Prior Authorization for Tosymra 10MG /ACT Nasal Sprayhas been APPROVED from 06/15/2023 to 06/15/2024   PA #/Case ID/Reference #: [id number]

## 2023-07-20 ENCOUNTER — Ambulatory Visit (INDEPENDENT_AMBULATORY_CARE_PROVIDER_SITE_OTHER): Payer: Medicare Other | Admitting: Gastroenterology

## 2023-10-11 ENCOUNTER — Ambulatory Visit: Payer: Medicare Other

## 2024-01-05 ENCOUNTER — Encounter (INDEPENDENT_AMBULATORY_CARE_PROVIDER_SITE_OTHER): Payer: Self-pay | Admitting: Gastroenterology
